# Patient Record
Sex: Female | Born: 1990 | Race: Black or African American | Hispanic: No | Marital: Single | Smoking: Light tobacco smoker
Health system: Southern US, Community
[De-identification: ages and names within clinical notes are randomized; demographics above are authoritative.]

## PROBLEM LIST (undated history)

## (undated) ENCOUNTER — Inpatient Hospital Stay: Payer: Self-pay

## (undated) DIAGNOSIS — O10919 Unspecified pre-existing hypertension complicating pregnancy, unspecified trimester: Secondary | ICD-10-CM

## (undated) DIAGNOSIS — D649 Anemia, unspecified: Secondary | ICD-10-CM

## (undated) DIAGNOSIS — F329 Major depressive disorder, single episode, unspecified: Secondary | ICD-10-CM

## (undated) DIAGNOSIS — D573 Sickle-cell trait: Secondary | ICD-10-CM

## (undated) DIAGNOSIS — F32A Depression, unspecified: Secondary | ICD-10-CM

## (undated) DIAGNOSIS — Z91199 Patient's noncompliance with other medical treatment and regimen due to unspecified reason: Secondary | ICD-10-CM

## (undated) DIAGNOSIS — D282 Benign neoplasm of uterine tubes and ligaments: Secondary | ICD-10-CM

## (undated) DIAGNOSIS — J45909 Unspecified asthma, uncomplicated: Secondary | ICD-10-CM

## (undated) DIAGNOSIS — Z9119 Patient's noncompliance with other medical treatment and regimen: Secondary | ICD-10-CM

## (undated) HISTORY — PX: TOOTH EXTRACTION: SUR596

---

## 2013-03-03 ENCOUNTER — Emergency Department: Payer: Self-pay | Admitting: Emergency Medicine

## 2013-03-03 LAB — URINALYSIS, COMPLETE
Bacteria: NONE SEEN
Bilirubin,UR: NEGATIVE
Leukocyte Esterase: NEGATIVE
Nitrite: NEGATIVE
Ph: 5 (ref 4.5–8.0)
Specific Gravity: 1.027 (ref 1.003–1.030)
Squamous Epithelial: 6

## 2013-03-03 LAB — BASIC METABOLIC PANEL
BUN: 13 mg/dL (ref 7–18)
Chloride: 106 mmol/L (ref 98–107)
Co2: 24 mmol/L (ref 21–32)
Creatinine: 0.75 mg/dL (ref 0.60–1.30)
Glucose: 76 mg/dL (ref 65–99)

## 2013-03-03 LAB — PREGNANCY, URINE: Pregnancy Test, Urine: NEGATIVE m[IU]/mL

## 2013-03-03 LAB — CBC
HCT: 37 % (ref 35.0–47.0)
HGB: 12.5 g/dL (ref 12.0–16.0)
MCH: 25.7 pg — ABNORMAL LOW (ref 26.0–34.0)
MCV: 76 fL — ABNORMAL LOW (ref 80–100)
Platelet: 282 10*3/uL (ref 150–440)
RBC: 4.86 10*6/uL (ref 3.80–5.20)

## 2013-04-26 ENCOUNTER — Emergency Department: Payer: Self-pay | Admitting: Emergency Medicine

## 2013-04-26 LAB — CBC
MCH: 26.1 pg (ref 26.0–34.0)
RBC: 4.29 10*6/uL (ref 3.80–5.20)
RDW: 16.1 % — ABNORMAL HIGH (ref 11.5–14.5)
WBC: 6.6 10*3/uL (ref 3.6–11.0)

## 2013-04-26 LAB — BASIC METABOLIC PANEL
Anion Gap: 6 — ABNORMAL LOW (ref 7–16)
Chloride: 112 mmol/L — ABNORMAL HIGH (ref 98–107)
EGFR (African American): >60
Osmolality: 283 (ref 275–301)
Potassium: 3.6 mmol/L (ref 3.5–5.1)

## 2013-04-26 LAB — URINALYSIS, COMPLETE
Ketone: NEGATIVE
Leukocyte Esterase: NEGATIVE
Nitrite: NEGATIVE
Ph: 5 (ref 4.5–8.0)
Protein: NEGATIVE
WBC UR: 1 /HPF (ref 0–5)

## 2013-04-26 LAB — HCG, QUANTITATIVE, PREGNANCY: Beta Hcg, Quant.: <1 m[IU]/mL — ABNORMAL LOW

## 2013-04-26 LAB — GC/CHLAMYDIA PROBE AMP

## 2013-04-26 LAB — WET PREP, GENITAL

## 2013-06-02 ENCOUNTER — Emergency Department: Payer: Self-pay | Admitting: Emergency Medicine

## 2013-06-02 LAB — COMPREHENSIVE METABOLIC PANEL
Albumin: 3.3 g/dL — ABNORMAL LOW (ref 3.4–5.0)
Anion Gap: 3 — ABNORMAL LOW (ref 7–16)
BUN: 11 mg/dL (ref 7–18)
Bilirubin,Total: 0.4 mg/dL (ref 0.2–1.0)
Co2: 28 mmol/L (ref 21–32)
Creatinine: 0.76 mg/dL (ref 0.60–1.30)
EGFR (African American): >60
EGFR (Non-African Amer.): >60
Glucose: 83 mg/dL (ref 65–99)
Osmolality: 272 (ref 275–301)
Potassium: 3.6 mmol/L (ref 3.5–5.1)
SGOT(AST): 13 U/L — ABNORMAL LOW (ref 15–37)
SGPT (ALT): 17 U/L (ref 12–78)
Total Protein: 7.2 g/dL (ref 6.4–8.2)

## 2013-06-02 LAB — GC/CHLAMYDIA PROBE AMP

## 2013-06-02 LAB — URINALYSIS, COMPLETE
Blood: NEGATIVE
Ketone: NEGATIVE
Leukocyte Esterase: NEGATIVE
Nitrite: NEGATIVE
Ph: 5 (ref 4.5–8.0)
Protein: NEGATIVE
Squamous Epithelial: 7

## 2013-06-02 LAB — CBC
HGB: 11.6 g/dL — ABNORMAL LOW (ref 12.0–16.0)
MCH: 25.7 pg — ABNORMAL LOW (ref 26.0–34.0)
Platelet: 302 10*3/uL (ref 150–440)
RDW: 15.8 % — ABNORMAL HIGH (ref 11.5–14.5)
WBC: 5.4 10*3/uL (ref 3.6–11.0)

## 2013-06-02 LAB — HCG, QUANTITATIVE, PREGNANCY: Beta Hcg, Quant.: <1 m[IU]/mL — ABNORMAL LOW

## 2013-06-02 LAB — WET PREP, GENITAL

## 2013-07-02 ENCOUNTER — Emergency Department: Payer: Self-pay | Admitting: Emergency Medicine

## 2013-07-02 LAB — COMPREHENSIVE METABOLIC PANEL
Alkaline Phosphatase: 73 U/L (ref 50–136)
Anion Gap: 4 — ABNORMAL LOW (ref 7–16)
Calcium, Total: 8.8 mg/dL (ref 8.5–10.1)
EGFR (Non-African Amer.): >60
Glucose: 78 mg/dL (ref 65–99)
Osmolality: 270 (ref 275–301)
Potassium: 3.3 mmol/L — ABNORMAL LOW (ref 3.5–5.1)
SGPT (ALT): 19 U/L (ref 12–78)

## 2013-07-02 LAB — CBC
HCT: 34.4 % — ABNORMAL LOW (ref 35.0–47.0)
MCH: 27.1 pg (ref 26.0–34.0)
MCV: 77 fL — ABNORMAL LOW (ref 80–100)
Platelet: 306 10*3/uL (ref 150–440)
RBC: 4.5 10*6/uL (ref 3.80–5.20)
RDW: 16.7 % — ABNORMAL HIGH (ref 11.5–14.5)

## 2013-07-02 LAB — HCG, QUANTITATIVE, PREGNANCY: Beta Hcg, Quant.: 173 m[IU]/mL — ABNORMAL HIGH

## 2013-07-02 LAB — GC/CHLAMYDIA PROBE AMP

## 2013-07-15 ENCOUNTER — Emergency Department: Payer: Self-pay | Admitting: Emergency Medicine

## 2013-07-15 LAB — CBC
HCT: 32.4 % — ABNORMAL LOW
HGB: 11.3 g/dL — ABNORMAL LOW
MCH: 26.7 pg
MCHC: 35 g/dL
MCV: 76 fL — ABNORMAL LOW
Platelet: 307 x10 3/mm 3
RBC: 4.25 X10 6/mm 3
RDW: 16.5 % — ABNORMAL HIGH
WBC: 8.4 x10 3/mm 3

## 2013-07-15 LAB — COMPREHENSIVE METABOLIC PANEL WITH GFR
Albumin: 3.3 g/dL — ABNORMAL LOW
Alkaline Phosphatase: 80 U/L
Anion Gap: 5 — ABNORMAL LOW
BUN: 11 mg/dL
Bilirubin,Total: <0.1 mg/dL — ABNORMAL LOW
Calcium, Total: 8.6 mg/dL
Chloride: 108 mmol/L — ABNORMAL HIGH
Co2: 26 mmol/L
Creatinine: 0.8 mg/dL
EGFR (African American): >60
EGFR (Non-African Amer.): >60
Glucose: 101 mg/dL — ABNORMAL HIGH
Osmolality: 277
Potassium: 3.5 mmol/L
SGOT(AST): 17 U/L
SGPT (ALT): 22 U/L
Sodium: 139 mmol/L
Total Protein: 7 g/dL

## 2013-07-15 LAB — URINALYSIS, COMPLETE
Blood: NEGATIVE
Glucose,UR: NEGATIVE mg/dL (ref 0–75)
Ketone: NEGATIVE
Ph: 6 (ref 4.5–8.0)
Specific Gravity: 1.023 (ref 1.003–1.030)
Squamous Epithelial: 7

## 2013-07-15 LAB — HCG, QUANTITATIVE, PREGNANCY: Beta Hcg, Quant.: 41 m[IU]/mL — ABNORMAL HIGH

## 2013-07-16 LAB — GC/CHLAMYDIA PROBE AMP

## 2013-07-16 LAB — WET PREP, GENITAL

## 2013-08-03 ENCOUNTER — Emergency Department: Payer: Self-pay | Admitting: Emergency Medicine

## 2013-08-03 LAB — RAPID INFLUENZA A&B ANTIGENS

## 2013-11-15 ENCOUNTER — Observation Stay: Payer: Self-pay | Admitting: Obstetrics & Gynecology

## 2013-11-15 LAB — CBC WITH DIFFERENTIAL/PLATELET
Basophil #: 0 10*3/uL (ref 0.0–0.1)
Basophil %: 0.5 %
Eosinophil #: 0.2 10*3/uL (ref 0.0–0.7)
Eosinophil %: 2.6 %
HCT: 35 % (ref 35.0–47.0)
HGB: 12.1 g/dL (ref 12.0–16.0)
LYMPHS ABS: 1.9 10*3/uL (ref 1.0–3.6)
LYMPHS PCT: 32.6 %
MCH: 26.1 pg (ref 26.0–34.0)
MCHC: 34.6 g/dL (ref 32.0–36.0)
MCV: 75 fL — ABNORMAL LOW (ref 80–100)
MONO ABS: 0.5 x10 3/mm (ref 0.2–0.9)
Monocyte %: 8.5 %
NEUTROS ABS: 3.3 10*3/uL (ref 1.4–6.5)
NEUTROS PCT: 55.8 %
PLATELETS: 289 10*3/uL (ref 150–440)
RBC: 4.65 10*6/uL (ref 3.80–5.20)
RDW: 16.2 % — AB (ref 11.5–14.5)
WBC: 6 10*3/uL (ref 3.6–11.0)

## 2013-11-15 LAB — HCG, QUANTITATIVE, PREGNANCY: Beta Hcg, Quant.: <1 m[IU]/mL — ABNORMAL LOW

## 2013-12-25 ENCOUNTER — Emergency Department: Payer: Self-pay | Admitting: Emergency Medicine

## 2013-12-25 LAB — CBC
HCT: 32.6 % — ABNORMAL LOW (ref 35.0–47.0)
HGB: 10.8 g/dL — ABNORMAL LOW (ref 12.0–16.0)
MCH: 25.4 pg — AB (ref 26.0–34.0)
MCHC: 33 g/dL (ref 32.0–36.0)
MCV: 77 fL — ABNORMAL LOW (ref 80–100)
Platelet: 279 10*3/uL (ref 150–440)
RBC: 4.24 10*6/uL (ref 3.80–5.20)
RDW: 15.9 % — AB (ref 11.5–14.5)
WBC: 7.6 10*3/uL (ref 3.6–11.0)

## 2013-12-25 LAB — HCG, QUANTITATIVE, PREGNANCY: Beta Hcg, Quant.: 189 m[IU]/mL — ABNORMAL HIGH

## 2014-10-02 IMAGING — US US PELV - US TRANSVAGINAL
1 series · 14 of 25 positions shown · non-contrast
Comparison: none

REASON FOR EXAM: left adnexal tenderness
COMMENTS:

PROCEDURE:     US  - US PELVIS EXAM W/TRANSVAGINAL  - June 02, 2013 [DATE]
RESULT:     Pelvic ultrasound dated 06/02/2013
Is correlated with prior ultrasound in 04/26/2013.

[Series 1: us pelv - us transvaginal · 0.28mm/px · 14 of 126 slices shown]
[im 1/126]
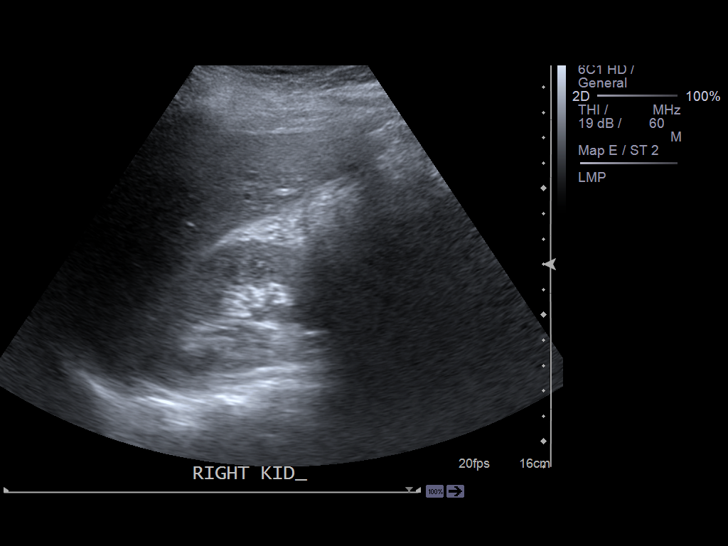
[im 11/126]
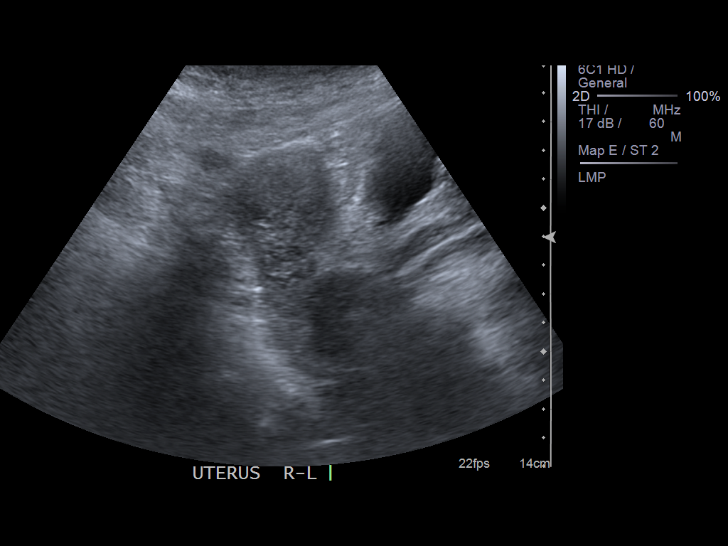
[im 21/126]
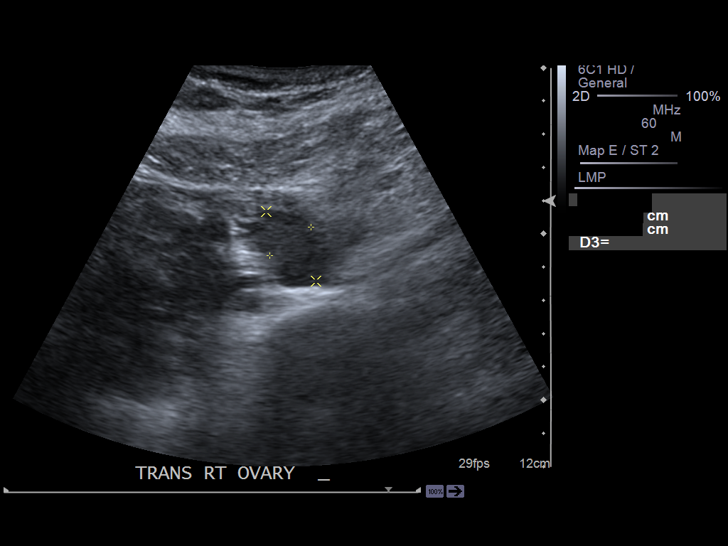
[im 32/126]
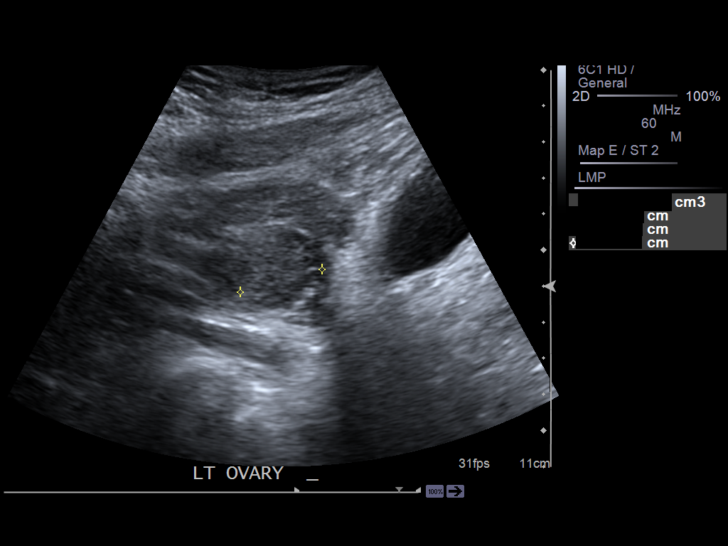
[im 42/126]
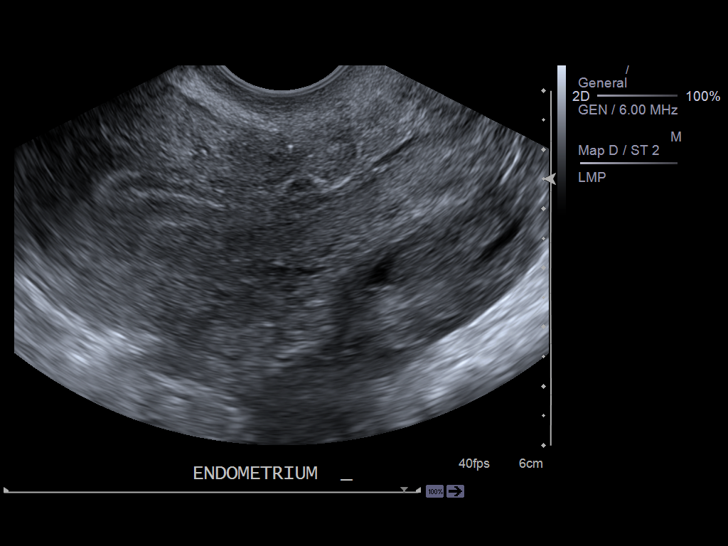
[im 47/126]
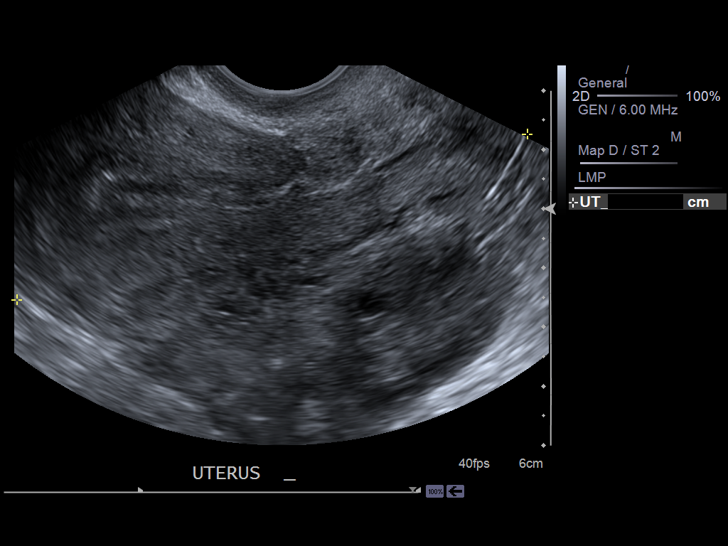
[im 58/126]
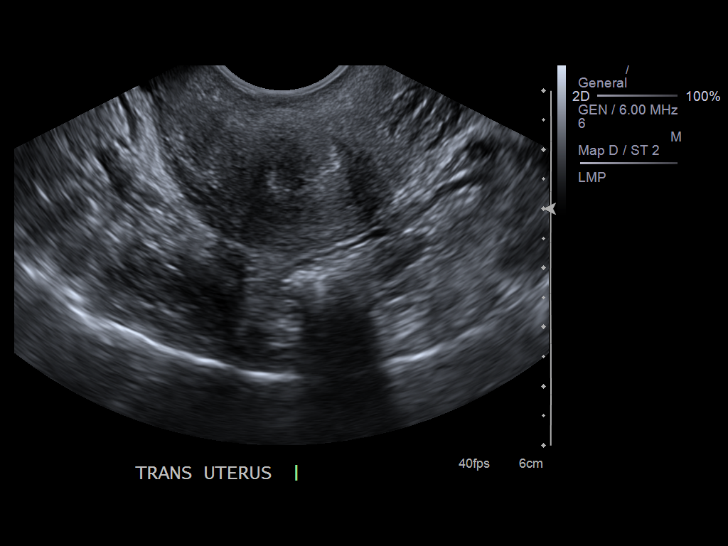
[im 68/126]
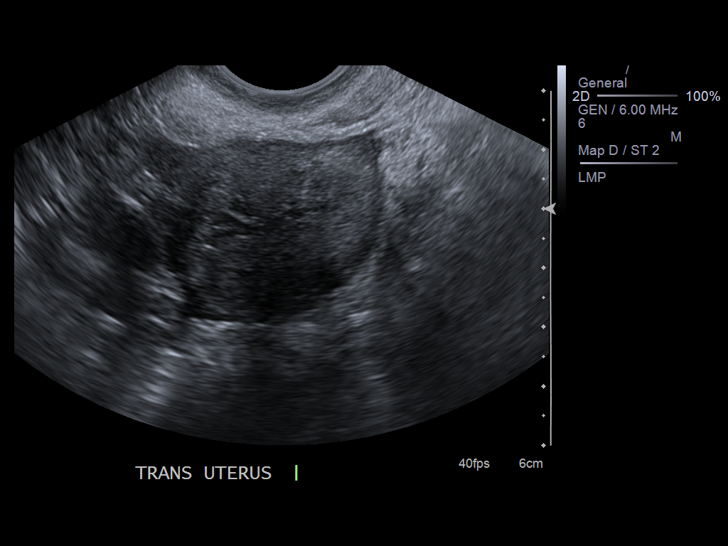
[im 79/126]
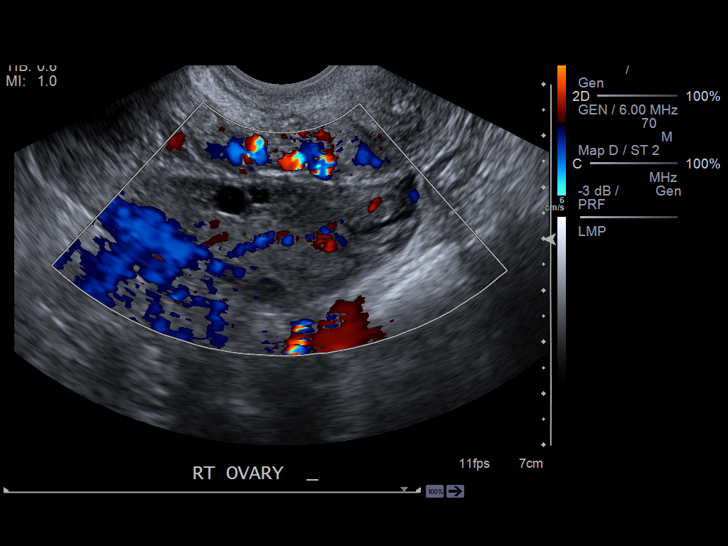
[im 84/126]
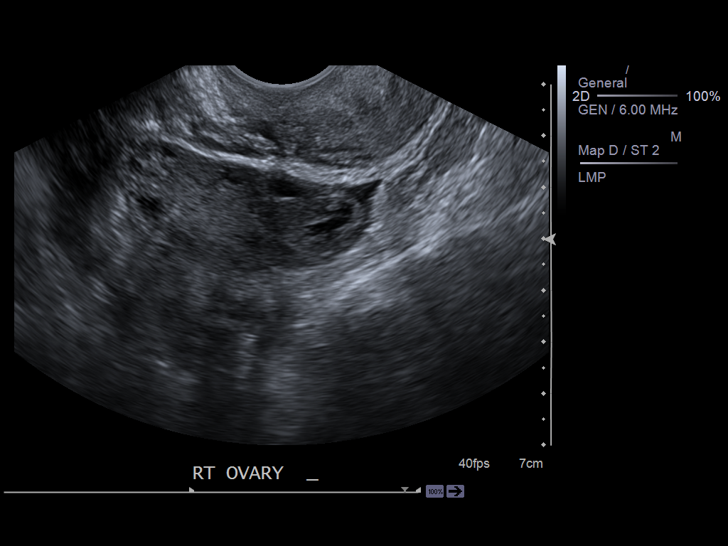
[im 94/126]
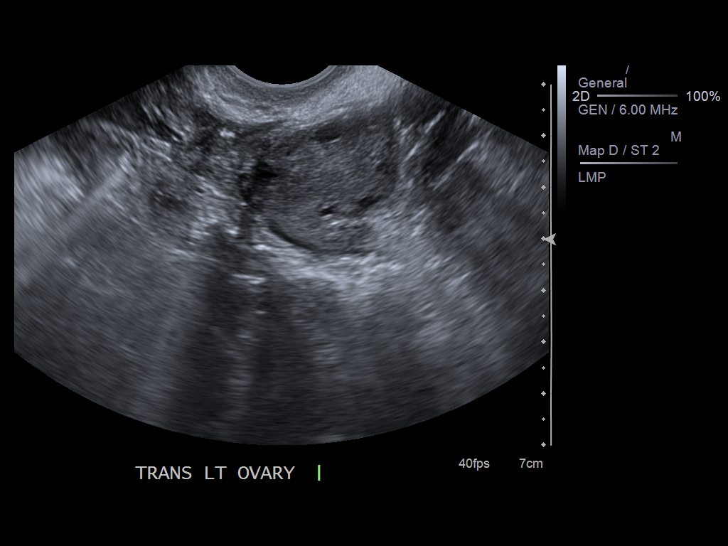
[im 105/126]
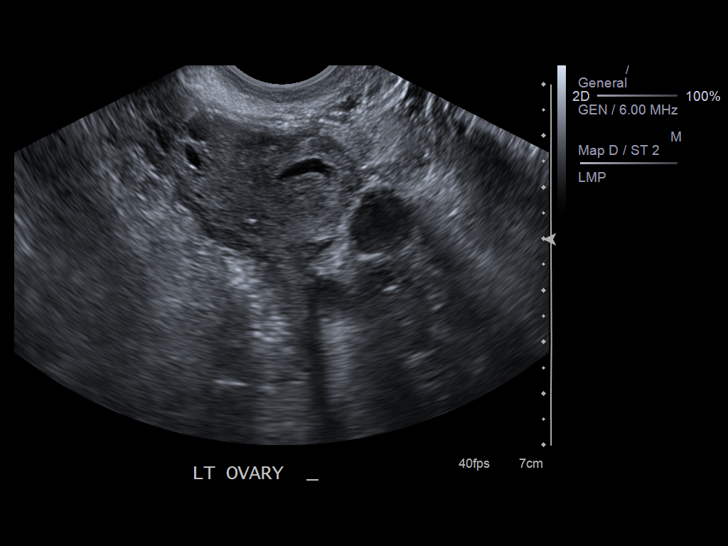
[im 115/126]
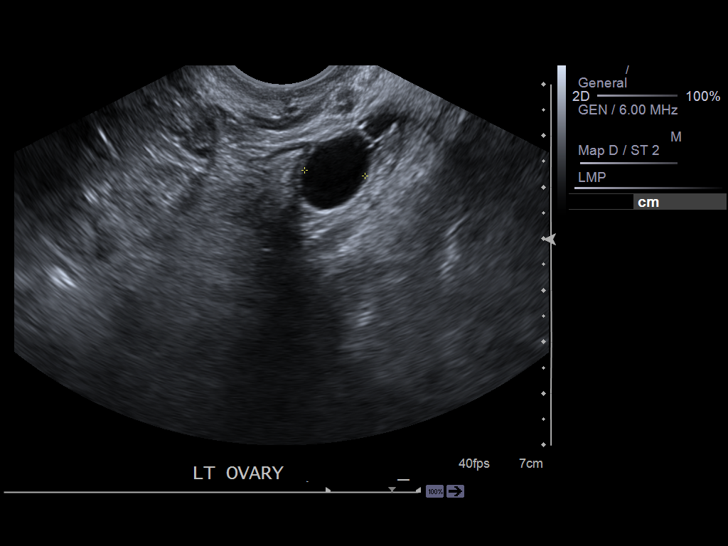
[im 126/126]
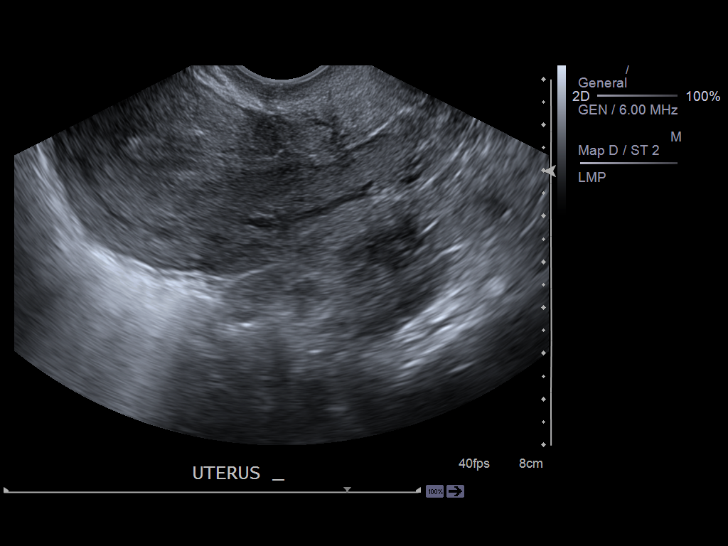

[14 of 25 positions shown; findings below may reference images not displayed]

FINDINGS: The uterus measures 9.07 x 5.04 x 4 cm. Dystrophic calcification
identified within the mid to lower uterine segment. The uterus is otherwise
homogeneous. Endometrial thickness is 2.5 mm. The right ovary measures
x 2.73 x 5.22 cm and the left 3.1 x 2.19 x 4.4 cm. Bilateral ovarian cyst
identified. Is no evidence for differences. This described cystic area to
the periphery within the left ovary is once again appreciated appears
stable. A dominant right ovarian cyst is appreciated measuring 1.5 a 1.5 x
1.67 cm. No solid masses identified.
IMPRESSION: Bilateral ovarian cysts as described above
2. Small amount of fluid within the cul-de-sac region
3. No further sonographic abnormalities.

## 2015-04-01 ENCOUNTER — Other Ambulatory Visit: Payer: Self-pay

## 2015-04-01 ENCOUNTER — Emergency Department: Payer: Medicaid Other

## 2015-04-01 ENCOUNTER — Emergency Department
Admission: EM | Admit: 2015-04-01 | Discharge: 2015-04-02 | Disposition: A | Discharge status: Home / Self Care | Payer: Medicaid Other | Attending: Emergency Medicine | Admitting: Emergency Medicine

## 2015-04-01 DIAGNOSIS — W19XXXA Unspecified fall, initial encounter: Secondary | ICD-10-CM

## 2015-04-01 DIAGNOSIS — R55 Syncope and collapse: Secondary | ICD-10-CM | POA: Insufficient documentation

## 2015-04-01 DIAGNOSIS — Z720 Tobacco use: Secondary | ICD-10-CM | POA: Diagnosis not present

## 2015-04-01 DIAGNOSIS — R519 Headache, unspecified: Secondary | ICD-10-CM

## 2015-04-01 DIAGNOSIS — M25552 Pain in left hip: Secondary | ICD-10-CM | POA: Diagnosis not present

## 2015-04-01 DIAGNOSIS — R51 Headache: Secondary | ICD-10-CM | POA: Diagnosis not present

## 2015-04-01 DIAGNOSIS — Z3202 Encounter for pregnancy test, result negative: Secondary | ICD-10-CM | POA: Insufficient documentation

## 2015-04-01 LAB — POCT PREGNANCY, URINE: Preg Test, Ur: NEGATIVE

## 2015-04-01 LAB — BASIC METABOLIC PANEL
Anion gap: 9 (ref 5–15)
BUN: 10 mg/dL (ref 6–20)
CO2: 23 mmol/L (ref 22–32)
Calcium: 9 mg/dL (ref 8.9–10.3)
Chloride: 106 mmol/L (ref 101–111)
Creatinine, Ser: 0.79 mg/dL (ref 0.44–1.00)
GFR calc Af Amer: >60 mL/min (ref >60)
GFR calc non Af Amer: >60 mL/min (ref >60)
Glucose, Bld: 101 mg/dL — ABNORMAL HIGH (ref 65–99)
Potassium: 3.5 mmol/L (ref 3.5–5.1)
Sodium: 138 mmol/L (ref 135–145)

## 2015-04-01 NOTE — ED Notes (Addendum)
To ED via [facility] c/o left hip pain and syncopal episode. Nausea and headache. left hip pain before syncope.

## 2015-04-01 NOTE — ED Notes (Signed)
Pt unable to urinate at this time, need POC pregnancy test. Pt unsure if pregnant

## 2015-04-02 ENCOUNTER — Emergency Department: Payer: Medicaid Other

## 2015-04-02 ENCOUNTER — Telehealth: Payer: Self-pay | Admitting: Emergency Medicine

## 2015-04-02 MED ORDER — KETOROLAC TROMETHAMINE 30 MG/ML IJ SOLN
30.0000 mg | Freq: Once | INTRAMUSCULAR | Status: AC
  Administered 2015-04-02: 30 mg via INTRAVENOUS

## 2015-04-02 MED ORDER — METOCLOPRAMIDE HCL 5 MG/ML IJ SOLN
INTRAMUSCULAR | Status: AC
  Filled 2015-04-02: qty 2

## 2015-04-02 MED ORDER — METOCLOPRAMIDE HCL 5 MG/ML IJ SOLN
10.0000 mg | Freq: Once | INTRAMUSCULAR | Status: AC
  Administered 2015-04-02: 10 mg via INTRAVENOUS

## 2015-04-02 MED ORDER — KETOROLAC TROMETHAMINE 30 MG/ML IJ SOLN
INTRAMUSCULAR | Status: AC
  Filled 2015-04-02: qty 1

## 2015-04-02 NOTE — Discharge Instructions (Signed)
General Headache Without Cause A general headache is pain or discomfort felt around the head or neck area. The cause may not be found.  HOME CARE   Keep all doctor visits.  Only take medicines as told by your doctor.  Lie down in a dark, quiet room when you have a headache.  Keep a journal to find out if certain things bring on headaches. For example, write down:  What you eat and drink.  How much sleep you get.  Any change to your diet or medicines.  Relax by getting a massage or doing other relaxing activities.  Put ice or heat packs on the head and neck area as told by your doctor.  Lessen stress.  Sit up straight. Do not tighten (tense) your muscles.  Quit smoking if you smoke.  Lessen how much alcohol you drink.  Lessen how much caffeine you drink, or stop drinking caffeine.  Eat and sleep on a regular schedule.  Get 7 to 9 hours of sleep, or as told by your doctor.  Keep lights dim if bright lights bother you or make your headaches worse. GET HELP RIGHT AWAY IF:   Your headache becomes really bad.  You have a fever.  You have a stiff neck.  You have trouble seeing.  Your muscles are weak, or you lose muscle control.  You lose your balance or have trouble walking.  You feel like you will pass out (faint), or you pass out.  You have really bad symptoms that are different than your first symptoms.  You have problems with the medicines given to you by your doctor.  Your medicines do not work.  Your headache feels different than the other headaches.  You feel sick to your stomach (nauseous) or throw up (vomit). MAKE SURE YOU:   Understand these instructions.  Will watch your condition.  Will get help right away if you are not doing well or get worse. Document Released: 08/02/2008 Document Revised: 01/16/2012 Document Reviewed: 10/14/2011 [facility] Patient Information 2015 [place], [place]. This information is not intended to replace advice given to  you by your health care provider. Make sure you discuss any questions you have with your health care provider.  Syncope Syncope is a medical term for fainting or passing out. This means you lose consciousness and drop to the ground. People are generally unconscious for less than 5 minutes. You may have some muscle twitches for up to 15 seconds before waking up and returning to normal. Syncope occurs more often in older adults, but it can happen to anyone. While most causes of syncope are not dangerous, syncope can be a sign of a serious medical problem. It is important to seek medical care.  CAUSES  Syncope is caused by a sudden drop in blood flow to the brain. The specific cause is often not determined. Factors that can bring on syncope include:  Taking medicines that lower blood pressure.  Sudden changes in posture, such as standing up quickly.  Taking more medicine than prescribed.  Standing in one place for too long.  Seizure disorders.  Dehydration and excessive exposure to heat.  Low blood sugar (hypoglycemia).  Straining to have a bowel movement.  Heart disease, irregular heartbeat, or other circulatory problems.  Fear, emotional distress, seeing blood, or severe pain. SYMPTOMS  Right before fainting, you may:  Feel dizzy or light-headed.  Feel nauseous.  See all white or all black in your field of vision.  Have cold, clammy skin. DIAGNOSIS  Your health care provider will ask about your symptoms, perform a physical exam, and perform an electrocardiogram (ECG) to record the electrical activity of your heart. Your health care provider may also perform other heart or blood tests to determine the cause of your syncope which may include:  Transthoracic echocardiogram (TTE). During echocardiography, sound waves are used to evaluate how blood flows through your heart.  Transesophageal echocardiogram (TEE).  Cardiac monitoring. This allows your health care provider to  monitor your heart rate and rhythm in real time.  Holter monitor. This is a portable device that records your heartbeat and can help diagnose heart arrhythmias. It allows your health care provider to track your heart activity for several days, if needed.  Stress tests by exercise or by giving medicine that makes the heart beat faster. TREATMENT  In most cases, no treatment is needed. Depending on the cause of your syncope, your health care provider may recommend changing or stopping some of your medicines. HOME CARE INSTRUCTIONS  Have someone stay with you until you feel stable.  Do not drive, use machinery, or play sports until your health care provider says it is okay.  Keep all follow-up appointments as directed by your health care provider.  Lie down right away if you start feeling like you might faint. Breathe deeply and steadily. Wait until all the symptoms have passed.  Drink enough fluids to keep your urine clear or pale yellow.  If you are taking blood pressure or heart medicine, get up slowly and take several minutes to sit and then stand. This can reduce dizziness. SEEK IMMEDIATE MEDICAL CARE IF:   You have a severe headache.  You have unusual pain in the chest, abdomen, or back.  You are bleeding from your mouth or rectum, or you have black or tarry stool.  You have an irregular or very fast heartbeat.  You have pain with breathing.  You have repeated fainting or seizure-like jerking during an episode.  You faint when sitting or lying down.  You have confusion.  You have trouble walking.  You have severe weakness.  You have vision problems. If you fainted, call your local emergency services (911 in [place] Do not drive yourself to the hospital.  MAKE SURE YOU:  Understand these instructions.  Will watch your condition.  Will get help right away if you are not doing well or get worse. Document Released: 10/24/2005 Document Revised: 10/29/2013 Document  Reviewed: 12/23/2011 [facility] Patient Information 2015 [place], [place]. This information is not intended to replace advice given to you by your health care provider. Make sure you discuss any questions you have with your health care provider.

## 2015-04-02 NOTE — ED Notes (Signed)
Patient transported to CT 

## 2015-04-02 NOTE — ED Provider Notes (Signed)
[facility] Emergency Department Provider Note  ____________________________________________  Time seen: 2:15 AM  I have reviewed the triage vital signs and the nursing notes.   HISTORY  Chief Complaint Loss of Consciousness and Hip Pain      HPI Kristin Sweeney is a 24 y.o. female resents with history of syncopal episode 1 with nausea and headache 1 day patient denies any symptoms prior to syncopal episode. Patient does admit to previous syncopal episodes in the past unsure of etiology. 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 No pertinent past medical history.  There are no active problems to display for this patient.   History reviewed. No pertinent past surgical history.  No current outpatient prescriptions on file.  Allergies Review of patient's allergies indicates no known allergies.  No family history on file.  Social History History  Substance Use Topics  . Smoking status: Current Every Day Smoker  . Smokeless tobacco: Not on file  . Alcohol Use: Yes    Review of Systems  Constitutional: Negative for fever. Eyes: Negative for visual changes. ENT: Negative for sore throat. Cardiovascular: Negative for chest pain. Respiratory: Negative for shortness of breath. Gastrointestinal: Negative for abdominal pain, vomiting and diarrhea. Genitourinary: Negative for dysuria. Musculoskeletal: Negative for back pain. Positive for left hip pain Skin: Negative for rash. Neurological: Negative for headaches, focal weakness or numbness.   10-point ROS otherwise negative.  ____________________________________________   PHYSICAL EXAM:  VITAL SIGNS: ED Triage Vitals  Enc Vitals Group     BP 04/01/15 2152 154/108 mmHg     Pulse Rate 04/01/15 2152 76     Resp 04/01/15 2152 16     Temp 04/01/15 2152 98.3 F (36.8 C)     Temp Source  04/01/15 2152 Oral     SpO2 04/01/15 2152 98 %     Weight 04/01/15 2152 230 lb (104.327 kg)     Height 04/01/15 2152 5\' 5"  (1.651 m)     Head Cir --      Peak Flow --      Pain Score 04/01/15 2153 10     Pain Loc --      Pain Edu? --      Excl. in [place]? --      Constitutional: Alert and oriented. Well appearing and in no distress. Eyes: Conjunctivae are normal. PERRL. Normal extraocular movements. ENT   Head: Normocephalic and atraumatic.   Nose: No congestion/rhinnorhea.   Mouth/Throat: Mucous membranes are moist.   Neck: No stridor. Cardiovascular: Normal rate, regular rhythm. Normal and symmetric distal pulses are present in all extremities. No murmurs, rubs, or gallops. Respiratory: Normal respiratory effort without tachypnea nor retractions. Breath sounds are clear and equal bilaterally. No wheezes/rales/rhonchi. Gastrointestinal: Soft and nontender. No distention. There is no CVA tenderness. Genitourinary: deferred Musculoskeletal: Nontender with normal range of motion in all extremities. No joint effusions.  No lower extremity tenderness nor edema. Neurologic:  Normal speech and language. No gross focal neurologic deficits are appreciated. Speech is normal.  Skin:  Skin is warm, dry and intact. No rash noted. Psychiatric: Mood and affect are normal. Speech and behavior are normal. Patient exhibits appropriate insight and judgment.  ____________________________________________    LABS (pertinent positives/negatives)  Labs Reviewed  BASIC METABOLIC PANEL - Abnormal; Notable for the following:    Glucose, Bld 101 (*)    All other components  within normal limits  POC URINE PREG, ED  POCT PREGNANCY, URINE     ____________________________________________    RADIOLOGY  CT head negative  ____________________________________________     INITIAL IMPRESSION / ASSESSMENT AND PLAN / ED COURSE  Pertinent labs & imaging results that were available during my  care of the patient were reviewed by me and considered in my medical decision making (see chart for details).  Unclear etiology for patient's syncopal episode however based on chess criteria patient will be discharged home with outpatient follow-up for syncopal episode. Regarding fallopian tube tumor that patient admonished patient states that she has a follow-up appointment with [place] [facility] to address this issue.  ____________________________________________   FINAL CLINICAL IMPRESSION(S) / ED DIAGNOSES  Final diagnoses:  Syncope, unspecified syncope type  Acute nonintractable headache, unspecified headache type      Gregor Hams, MD 04/09/15 [phone]

## 2015-04-02 NOTE — ED Notes (Signed)
Pt verbalizes understanding of discharge instructions.

## 2015-04-02 NOTE — ED Notes (Signed)
Pt back from CT

## 2015-04-02 NOTE — ED Notes (Signed)
Pt called and left message about needing referral to [facility].  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  This was diagnosed elsewhere and she was supposed tohave surgery.  She did not have the surgery last month due to being homeless.  She is in process of having her medicaid transferred as she just moved here.  i told her she need to establish with pcp and then they will refer to specialist based on what her diagnosis is.  She agrees.

## 2015-05-06 ENCOUNTER — Emergency Department: Payer: Medicaid Other

## 2015-05-06 ENCOUNTER — Encounter: Payer: Self-pay | Admitting: Emergency Medicine

## 2015-05-06 ENCOUNTER — Emergency Department
Admission: EM | Admit: 2015-05-06 | Discharge: 2015-05-06 | Disposition: A | Discharge status: Home / Self Care | Payer: Medicaid Other | Attending: Emergency Medicine | Admitting: Emergency Medicine

## 2015-05-06 DIAGNOSIS — Z3201 Encounter for pregnancy test, result positive: Secondary | ICD-10-CM | POA: Insufficient documentation

## 2015-05-06 DIAGNOSIS — F17210 Nicotine dependence, cigarettes, uncomplicated: Secondary | ICD-10-CM | POA: Diagnosis not present

## 2015-05-06 DIAGNOSIS — S99922A Unspecified injury of left foot, initial encounter: Secondary | ICD-10-CM | POA: Diagnosis present

## 2015-05-06 DIAGNOSIS — Y998 Other external cause status: Secondary | ICD-10-CM | POA: Diagnosis not present

## 2015-05-06 DIAGNOSIS — R52 Pain, unspecified: Secondary | ICD-10-CM

## 2015-05-06 DIAGNOSIS — Y9389 Activity, other specified: Secondary | ICD-10-CM | POA: Insufficient documentation

## 2015-05-06 DIAGNOSIS — X58XXXA Exposure to other specified factors, initial encounter: Secondary | ICD-10-CM | POA: Insufficient documentation

## 2015-05-06 DIAGNOSIS — Y9289 Other specified places as the place of occurrence of the external cause: Secondary | ICD-10-CM | POA: Insufficient documentation

## 2015-05-06 DIAGNOSIS — Z3490 Encounter for supervision of normal pregnancy, unspecified, unspecified trimester: Secondary | ICD-10-CM

## 2015-05-06 DIAGNOSIS — Z331 Pregnant state, incidental: Secondary | ICD-10-CM | POA: Insufficient documentation

## 2015-05-06 DIAGNOSIS — S93602A Unspecified sprain of left foot, initial encounter: Secondary | ICD-10-CM

## 2015-05-06 LAB — POCT PREGNANCY, URINE: PREG TEST UR: POSITIVE — AB

## 2015-05-06 MED ORDER — ACETAMINOPHEN 325 MG PO TABS
ORAL_TABLET | ORAL | Status: AC
  Filled 2015-05-06: qty 2

## 2015-05-06 MED ORDER — ACETAMINOPHEN 325 MG PO TABS
650.0000 mg | ORAL_TABLET | Freq: Once | ORAL | Status: AC
  Administered 2015-05-06: 650 mg via ORAL

## 2015-05-06 NOTE — ED Notes (Signed)
C/o left ankle pain, states she twisted left ankle last Friday night

## 2015-05-06 NOTE — Discharge Instructions (Signed)
Take tylenol as needed for pain

## 2015-05-06 NOTE — ED Provider Notes (Signed)
[facility] Emergency Department Provider Note  ____________________________________________  Time seen: 1204  I have reviewed the triage vital signs and the nursing notes.   HISTORY  Chief Complaint Foot Pain    HPI Kristin Sweeney is a 24 y.o. female is here today with left foot pain and a concern of pregnancy because she is having all of the symptoms but has not had time to take the test she said she has not a. She believes a little over a month no other complaints this time rates her foot pain as about a 7 out of 10 think she twisted it a couple days ago hurts when she bears weight relieved when she stays off of it rates it as a burning pain and she is here today for pregnancy test and further evaluation and treatment of her foot    No past medical history on file.  There are no active problems to display for this patient.   No past surgical history on file.  No current outpatient prescriptions on file.  Allergies Review of patient's allergies indicates no known allergies.  No family history on file.  Social History History  Substance Use Topics  . Smoking status: Current Every Day Smoker    Types: Cigarettes  . Smokeless tobacco: Not on file  . Alcohol Use: Yes    Review of Systems Constitutional: No fever/chills Eyes: No visual changes. ENT: No sore throat. Cardiovascular: Denies chest pain. Respiratory: Denies shortness of breath. Gastrointestinal: No abdominal pain.  No nausea, no vomiting.  No diarrhea.  No constipation. Genitourinary: Negative for dysuria. Musculoskeletal: Negative for back pain. Skin: Negative for rash. Neurological: Negative for headaches, focal weakness or numbness.  10-point ROS otherwise negative.  ____________________________________________   PHYSICAL EXAM:  VITAL SIGNS: ED Triage Vitals  Enc Vitals Group     BP 05/06/15 1157 136/112 mmHg     Pulse Rate 05/06/15 1157 84     Resp 05/06/15  1157 18     Temp 05/06/15 1157 99.7 F (37.6 C)     Temp Source 05/06/15 1157 Oral     SpO2 05/06/15 1157 98 %     Weight 05/06/15 1157 258 lb (117.028 kg)     Height 05/06/15 1157 5\' 6"  (1.676 m)     Head Cir --      Peak Flow --      Pain Score 05/06/15 1157 8     Pain Loc --      Pain Edu? --      Excl. in [place]? --     Constitutional: Alert and oriented. Well appearing and in no acute distress. Eyes: Conjunctivae are normal. PERRL. EOMI. Head: Atraumatic. Nose: No congestion/rhinnorhea. Mouth/Throat: Mucous membranes are moist.  Oropharynx non-erythematous. Neck: No stridor.   Cardiovascular: Normal rate, regular rhythm. Grossly normal heart sounds.  Good peripheral circulation. Respiratory: Normal respiratory effort.  No retractions. Lungs CTAB. Musculoskeletal: Pain with palpation over the lateral aspect of the metatarsals of her left foot without palpable deformity or abnormality full range of motion both active and passively more pain with flexion of the foot Neurologic:  Normal speech and language. No gross focal neurologic deficits are appreciated. Speech is normal. No gait instability. Skin:  Skin is warm, dry and intact. No rash noted. Psychiatric: Mood and affect are normal. Speech and behavior are normal.  ____________________________________________   LABS (all labs ordered are listed, but only abnormal results are displayed)  Labs Reviewed  POCT PREGNANCY, URINE -  Abnormal; Notable for the following:    Preg Test, Ur POSITIVE (*)    All other components within normal limits  POC URINE PREG, ED    RADIOLOGY  X-rays of the patient's left foot were negative I, Aubrei Bouchie,  Wilmore Holsomback, Luanna Cole, personally viewed and evaluated these images as part of my medical decision making.   ____________________________________________   PROCEDURES  Procedure(s) performed: Ace wrap and air splint were applied to the patient's left foot she is also placed on crutches  Critical  Care performed: No  ____________________________________________   INITIAL IMPRESSION / ASSESSMENT AND PLAN / ED COURSE  Pertinent labs & imaging results that were available during my care of the patient were reviewed by me and considered in my medical decision making (see chart for details).  Initial impression on this patient left foot sprain pregnancy and discussed the patient she needs to seek some prenatal care she artery has a relationship with [facility] says her she should follow-up with orthopedics given information from Dr. Roland Rack if symptoms persist return here for any acute concerns or worsening symptoms ____________________________________________   FINAL CLINICAL IMPRESSION(S) / ED DIAGNOSES  Final diagnoses:  Pain  Foot sprain, left, initial encounter  Pregnancy     Bishoy Cupp Verdene Rio, PA-C 05/06/15 1351  Nance Pear, MD 05/06/15 1353

## 2015-05-06 NOTE — ED Notes (Signed)
Pulses noted as 2+ in left foot. Patient is able to flex and extend her toes without issue. Patient ambulatory to pod d.   Patient also states "I may be pregnant"

## 2015-05-14 ENCOUNTER — Emergency Department
Admission: EM | Admit: 2015-05-14 | Discharge: 2015-05-15 | Disposition: A | Discharge status: Home / Self Care | Payer: Medicaid Other | Attending: Emergency Medicine | Admitting: Emergency Medicine

## 2015-05-14 ENCOUNTER — Encounter: Payer: Self-pay | Admitting: Emergency Medicine

## 2015-05-14 DIAGNOSIS — O26899 Other specified pregnancy related conditions, unspecified trimester: Secondary | ICD-10-CM

## 2015-05-14 DIAGNOSIS — F17210 Nicotine dependence, cigarettes, uncomplicated: Secondary | ICD-10-CM | POA: Diagnosis not present

## 2015-05-14 DIAGNOSIS — N760 Acute vaginitis: Secondary | ICD-10-CM

## 2015-05-14 DIAGNOSIS — O23591 Infection of other part of genital tract in pregnancy, first trimester: Secondary | ICD-10-CM | POA: Insufficient documentation

## 2015-05-14 DIAGNOSIS — B9689 Other specified bacterial agents as the cause of diseases classified elsewhere: Secondary | ICD-10-CM

## 2015-05-14 DIAGNOSIS — O10011 Pre-existing essential hypertension complicating pregnancy, first trimester: Secondary | ICD-10-CM | POA: Insufficient documentation

## 2015-05-14 DIAGNOSIS — Z3A01 Less than 8 weeks gestation of pregnancy: Secondary | ICD-10-CM | POA: Diagnosis not present

## 2015-05-14 DIAGNOSIS — O209 Hemorrhage in early pregnancy, unspecified: Secondary | ICD-10-CM | POA: Diagnosis present

## 2015-05-14 DIAGNOSIS — O99331 Smoking (tobacco) complicating pregnancy, first trimester: Secondary | ICD-10-CM | POA: Diagnosis not present

## 2015-05-14 DIAGNOSIS — R109 Unspecified abdominal pain: Secondary | ICD-10-CM

## 2015-05-14 HISTORY — DX: Anemia, unspecified: D64.9

## 2015-05-14 HISTORY — DX: Unspecified asthma, uncomplicated: J45.909

## 2015-05-14 LAB — ABO/RH: ABO/RH(D): B POS

## 2015-05-14 LAB — COMPREHENSIVE METABOLIC PANEL
ALBUMIN: 3.5 g/dL (ref 3.5–5.0)
ALK PHOS: 55 U/L (ref 38–126)
ALT: 19 U/L (ref 14–54)
AST: 18 U/L (ref 15–41)
Anion gap: 6 (ref 5–15)
BUN: 13 mg/dL (ref 6–20)
CO2: 24 mmol/L (ref 22–32)
CREATININE: 0.74 mg/dL (ref 0.44–1.00)
Calcium: 8.6 mg/dL — ABNORMAL LOW (ref 8.9–10.3)
Chloride: 110 mmol/L (ref 101–111)
GFR calc Af Amer: >60 mL/min (ref >60)
GLUCOSE: 106 mg/dL — AB (ref 65–99)
Potassium: 3.6 mmol/L (ref 3.5–5.1)
Sodium: 140 mmol/L (ref 135–145)
Total Bilirubin: 0.3 mg/dL (ref 0.3–1.2)
Total Protein: 7 g/dL (ref 6.5–8.1)

## 2015-05-14 LAB — CBC WITH DIFFERENTIAL/PLATELET
BASOS ABS: 0.1 10*3/uL (ref 0–0.1)
Basophils Relative: 1 %
EOS PCT: 2 %
Eosinophils Absolute: 0.2 10*3/uL (ref 0–0.7)
HCT: 30.4 % — ABNORMAL LOW (ref 35.0–47.0)
Hemoglobin: 10.3 g/dL — ABNORMAL LOW (ref 12.0–16.0)
LYMPHS ABS: 2.9 10*3/uL (ref 1.0–3.6)
LYMPHS PCT: 33 %
MCH: 25.7 pg — AB (ref 26.0–34.0)
MCHC: 33.8 g/dL (ref 32.0–36.0)
MCV: 75.9 fL — AB (ref 80.0–100.0)
Monocytes Absolute: 0.7 10*3/uL (ref 0.2–0.9)
Monocytes Relative: 8 %
NEUTROS ABS: 4.9 10*3/uL (ref 1.4–6.5)
NEUTROS PCT: 56 %
PLATELETS: 277 10*3/uL (ref 150–440)
RBC: 4 MIL/uL (ref 3.80–5.20)
RDW: 16.3 % — ABNORMAL HIGH (ref 11.5–14.5)
WBC: 8.8 10*3/uL (ref 3.6–11.0)

## 2015-05-14 LAB — TROPONIN I: Troponin I: <0.03 ng/mL (ref <0.031)

## 2015-05-14 LAB — OB RESULTS CONSOLE ABO/RH: RH TYPE: POSITIVE

## 2015-05-14 NOTE — ED Provider Notes (Signed)
[facility] Emergency Department Provider Note  ____________________________________________  Time seen: Approximately 11:10 PM  I have reviewed the triage vital signs and the nursing notes.   HISTORY  Chief Complaint Abdominal Pain and Vaginal Bleeding    HPI Kristin Sweeney is a 24 y.o. female who is a G6 P2 with a previous ectopic pregnancy who presents with left lower quadrant pain and vaginal bleeding for the past day. She says that she has had a constant dull pain with 2-3 second bursts of sharp pain to the left lower quadrant. She also reports more frequent urination. She says she has had spotting as well as a flow of blood several hours ago without any tissue passed. She says that now she feels that it has returned to a mild spotting. No dysuria. Says that she does have a left-sided tubal mass which was originally thought to be a ovarian cyst but now thought to be a mass that was supposed to be removed surgically. However, she has moved from [place] to [place] and now back again and says that her care has been discontinuous. Is not taking prenatal vitamins at this time. Is trying to get set up with prenatal care but says cannot go to [place] side because of her insurance status. Is trying to get set up with the health department. Not requesting any pain medication at this time. Says does not want any medication. Only mild pain now. Has had nausea and vomiting about this pregnancy but not complaining of any issues at this time.Does have a history of trichomonas in the past.   Past Medical History  Diagnosis Date  . Hypertension   . Asthma   . Anemia     There are no active problems to display for this patient.   History reviewed. No pertinent past surgical history.  No current outpatient prescriptions on file.  Allergies Review of patient's allergies indicates no known allergies.  No family history on file.  Social History History   Substance Use Topics  . Smoking status: Current Every Day Smoker    Types: Cigarettes  . Smokeless tobacco: Not on file  . Alcohol Use: Yes    Review of Systems Constitutional: No fever/chills Eyes: No visual changes. ENT: No sore throat. Cardiovascular: Denies chest pain. Respiratory: Denies shortness of breath. Gastrointestinal:  No diarrhea.  No constipation. Genitourinary: Negative for dysuria. Musculoskeletal: Negative for back pain. Skin: Negative for rash. Neurological: Negative for headaches, focal weakness or numbness.  10-point ROS otherwise negative.  ____________________________________________   PHYSICAL EXAM:  VITAL SIGNS: ED Triage Vitals  Enc Vitals Group     BP 05/14/15 2220 134/76 mmHg     Pulse Rate 05/14/15 2220 86     Resp 05/14/15 2220 20     Temp 05/14/15 2220 98.1 F (36.7 C)     Temp Source 05/14/15 2220 Oral     SpO2 05/14/15 2220 98 %     Weight 05/14/15 2220 234 lb (106.142 kg)     Height 05/14/15 2220 5\' 6"  (1.676 m)     Head Cir --      Peak Flow --      Pain Score 05/14/15 2221 10     Pain Loc --      Pain Edu? --      Excl. in [place]? --     Constitutional: Alert and oriented. Well appearing and in no acute distress. Eyes: Conjunctivae are normal. PERRL. EOMI. Head: Atraumatic. Nose: No congestion/rhinnorhea.  Mouth/Throat: Mucous membranes are moist.  Oropharynx non-erythematous. Neck: No stridor.   Cardiovascular: Normal rate, regular rhythm. Grossly normal heart sounds.  Good peripheral circulation. Respiratory: Normal respiratory effort.  No retractions. Lungs CTAB. Gastrointestinal: Soft with mild tenderness palpation to the lower abdomen. There is no rebound or guarding. No distention. No abdominal bruits. No CVA tenderness. Genitourinary: Musculoskeletal: No lower extremity tenderness nor edema.  No joint effusions. Neurologic:  Normal speech and language. No gross focal neurologic deficits are appreciated. Speech is normal.  No gait instability. Skin:  Skin is warm, dry and intact. No rash noted. Psychiatric: Mood and affect are normal. Speech and behavior are normal.  ____________________________________________   LABS (all labs ordered are listed, but only abnormal results are displayed)  Labs Reviewed  WET PREP, GENITAL - Abnormal; Notable for the following:    Clue Cells Wet Prep HPF POC RARE (*)    WBC, Wet Prep HPF POC MODERATE (*)    All other components within normal limits  CBC WITH DIFFERENTIAL/PLATELET - Abnormal; Notable for the following:    Hemoglobin 10.3 (*)    HCT 30.4 (*)    MCV 75.9 (*)    MCH 25.7 (*)    RDW 16.3 (*)    All other components within normal limits  COMPREHENSIVE METABOLIC PANEL - Abnormal; Notable for the following:    Glucose, Bld 106 (*)    Calcium 8.6 (*)    All other components within normal limits  URINALYSIS COMPLETEWITH MICROSCOPIC ([facility] ONLY) - Abnormal; Notable for the following:    Color, Urine YELLOW (*)    APPearance CLEAR (*)    Squamous Epithelial / LPF 0-5 (*)    All other components within normal limits  HCG, QUANTITATIVE, PREGNANCY - Abnormal; Notable for the following:    hCG, Beta Chain, Quant, S 3200 (*)    All other components within normal limits  POCT PREGNANCY, URINE - Abnormal; Notable for the following:    Preg Test, Ur POSITIVE (*)    All other components within normal limits  CHLAMYDIA/NGC RT PCR ([facility] ONLY)  TROPONIN I  POC URINE PREG, ED  TYPE AND SCREEN  ABO/RH   ____________________________________________  EKG  ED ECG REPORT I, Doran Stabler, the attending physician, personally viewed and interpreted this ECG.   Date: 05/14/2015  EKG Time: 2243  Rate: 70  Rhythm: normal sinus rhythm  Axis: Normal axis  Intervals:none  ST&T Change: No ST elevations or depressions. No abnormal T-wave inversions.  Voltage criteria for LVH.  ____________________________________________  RADIOLOGY Single  intrauterine gestational sac noted at 5 weeks and 2 days.  ____________________________________________   PROCEDURES    ____________________________________________   INITIAL IMPRESSION / ASSESSMENT AND PLAN / ED COURSE  Pertinent labs & imaging results that were available during my care of the patient were reviewed by me and considered in my medical decision making (see chart for details).  ----------------------------------------- 2:03 AM on 05/15/2015 -----------------------------------------  Patient resting comfortably at this time. No vomiting in the emergency department. We'll treat for bacterial vaginosis. We'll discharge with prenatal vitamins as well as vitamin B6. Discussed case with Dr. Kenton Kingfisher who said that the patient could be seen Monday as a follow-up in the clinic at [facility] side where the patient has been seen before.  Says that the patient should have a repeat hCG in 2 days that I can write him a prescription to follow-up with [facility] side OB/GYN with the results. ____________________________________________   FINAL CLINICAL IMPRESSION(S) /  ED DIAGNOSES  Acute lower abdominal pain and pregnancy. Acute bacterial vaginosis. Initial visit.      Orbie Pyo, MD 05/15/15 [phone]

## 2015-05-14 NOTE — ED Notes (Signed)
Pt presents to ED via EMS with c/o of vaginal bleeding and left side abdominal pain. EMS states pt is unaware of exact date of conception/pregnancy week number. 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 EMS states pt reports vaginal spotting starting since yesterday, minimal amount gradually increasing to steady amount today. EMS states pt denies clots at this time. Pt arrived to ED alert and oriented x4. Pt states pain is located to left side abdomen radiating to left back area. Pt states blood color is light red, pink. Pt states she has had 3 live births and 3 miscarriages. VS per EMS listed below:   154/90 BP 82 HR 98% O2 RA NSR

## 2015-05-15 ENCOUNTER — Emergency Department: Payer: Medicaid Other

## 2015-05-15 LAB — URINALYSIS COMPLETE WITH MICROSCOPIC (ARMC ONLY)
BACTERIA UA: NONE SEEN
Bilirubin Urine: NEGATIVE
GLUCOSE, UA: NEGATIVE mg/dL
Hgb urine dipstick: NEGATIVE
Ketones, ur: NEGATIVE mg/dL
Leukocytes, UA: NEGATIVE
Nitrite: NEGATIVE
Protein, ur: NEGATIVE mg/dL
Specific Gravity, Urine: 1.028 (ref 1.005–1.030)
pH: 6 (ref 5.0–8.0)

## 2015-05-15 LAB — CHLAMYDIA/NGC RT PCR (ARMC ONLY)
CHLAMYDIA TR: NOT DETECTED
N gonorrhoeae: NOT DETECTED

## 2015-05-15 LAB — TYPE AND SCREEN
ABO/RH(D): B POS
ANTIBODY SCREEN: NEGATIVE

## 2015-05-15 LAB — WET PREP, GENITAL
Trich, Wet Prep: NONE SEEN
Yeast Wet Prep HPF POC: NONE SEEN

## 2015-05-15 LAB — HCG, QUANTITATIVE, PREGNANCY: hCG, Beta Chain, Quant, S: 3200 m[IU]/mL — ABNORMAL HIGH (ref <5)

## 2015-05-15 LAB — POCT PREGNANCY, URINE: Preg Test, Ur: POSITIVE — AB

## 2015-05-15 MED ORDER — PRENATAL VITAMINS 0.8 MG PO TABS: 1.0000 | ORAL_TABLET | Freq: Every day | ORAL | Status: DC

## 2015-05-15 MED ORDER — METRONIDAZOLE 500 MG PO TABS: 500.0000 mg | ORAL_TABLET | Freq: Two times a day (BID) | ORAL | Status: AC

## 2015-05-15 MED ORDER — ACETAMINOPHEN 325 MG PO TABS
650.0000 mg | ORAL_TABLET | Freq: Once | ORAL | Status: AC
Start: 2015-05-15 — End: 2015-05-15
  Administered 2015-05-15: 650 mg via ORAL
  Filled 2015-05-15: qty 2

## 2015-05-15 MED ORDER — METRONIDAZOLE 500 MG PO TABS
500.0000 mg | ORAL_TABLET | Freq: Once | ORAL | Status: AC
  Administered 2015-05-15: 500 mg via ORAL
  Filled 2015-05-15: qty 1

## 2015-05-15 MED ORDER — METOCLOPRAMIDE HCL 10 MG PO TABS
10.0000 mg | ORAL_TABLET | Freq: Once | ORAL | Status: AC
  Administered 2015-05-15: 10 mg via ORAL
  Filled 2015-05-15: qty 1

## 2015-05-15 MED ORDER — VITAMIN B-6 50 MG PO TABS: 75.0000 mg | ORAL_TABLET | Freq: Every day | ORAL | Status: DC

## 2015-05-15 NOTE — ED Notes (Signed)
Patient with no complaints at this time. Respirations even and unlabored. Skin warm/dry. Discharge instructions reviewed with patient at this time. Patient given opportunity to voice concerns/ask questions. IV removed per policy and band-aid applied to site. Patient discharged at this time and left Emergency Department with steady gait.  

## 2015-05-15 NOTE — Discharge Instructions (Signed)

## 2015-05-21 ENCOUNTER — Emergency Department
Admission: EM | Admit: 2015-05-21 | Discharge: 2015-05-21 | Disposition: A | Discharge status: Home / Self Care | Payer: Medicaid Other | Attending: Emergency Medicine | Admitting: Emergency Medicine

## 2015-05-21 ENCOUNTER — Encounter: Payer: Self-pay | Admitting: Urgent Care

## 2015-05-21 ENCOUNTER — Emergency Department: Payer: Medicaid Other

## 2015-05-21 DIAGNOSIS — O200 Threatened abortion: Secondary | ICD-10-CM

## 2015-05-21 DIAGNOSIS — Y9389 Activity, other specified: Secondary | ICD-10-CM | POA: Diagnosis not present

## 2015-05-21 DIAGNOSIS — Y998 Other external cause status: Secondary | ICD-10-CM | POA: Insufficient documentation

## 2015-05-21 DIAGNOSIS — S3991XA Unspecified injury of abdomen, initial encounter: Secondary | ICD-10-CM | POA: Insufficient documentation

## 2015-05-21 DIAGNOSIS — O9A211 Injury, poisoning and certain other consequences of external causes complicating pregnancy, first trimester: Secondary | ICD-10-CM | POA: Diagnosis present

## 2015-05-21 DIAGNOSIS — Y9289 Other specified places as the place of occurrence of the external cause: Secondary | ICD-10-CM | POA: Insufficient documentation

## 2015-05-21 DIAGNOSIS — Z79899 Other long term (current) drug therapy: Secondary | ICD-10-CM | POA: Insufficient documentation

## 2015-05-21 DIAGNOSIS — F17210 Nicotine dependence, cigarettes, uncomplicated: Secondary | ICD-10-CM | POA: Diagnosis not present

## 2015-05-21 DIAGNOSIS — O10011 Pre-existing essential hypertension complicating pregnancy, first trimester: Secondary | ICD-10-CM | POA: Diagnosis not present

## 2015-05-21 DIAGNOSIS — Z3A01 Less than 8 weeks gestation of pregnancy: Secondary | ICD-10-CM | POA: Diagnosis not present

## 2015-05-21 DIAGNOSIS — R1084 Generalized abdominal pain: Secondary | ICD-10-CM

## 2015-05-21 DIAGNOSIS — O99331 Smoking (tobacco) complicating pregnancy, first trimester: Secondary | ICD-10-CM | POA: Insufficient documentation

## 2015-05-21 DIAGNOSIS — Z3491 Encounter for supervision of normal pregnancy, unspecified, first trimester: Secondary | ICD-10-CM

## 2015-05-21 HISTORY — DX: Benign neoplasm of uterine tubes and ligaments: D28.2

## 2015-05-21 LAB — URINALYSIS COMPLETE WITH MICROSCOPIC (ARMC ONLY)
BILIRUBIN URINE: NEGATIVE
Bacteria, UA: NONE SEEN
GLUCOSE, UA: NEGATIVE mg/dL
Hgb urine dipstick: NEGATIVE
NITRITE: NEGATIVE
PROTEIN: NEGATIVE mg/dL
SPECIFIC GRAVITY, URINE: 1.03 (ref 1.005–1.030)
pH: 5 (ref 5.0–8.0)
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
## 2015-05-21 NOTE — Discharge Instructions (Signed)
First Trimester of Pregnancy The first trimester of pregnancy is from week 1 until the end of week 12 (months 1 through 3). A week after a sperm fertilizes an egg, the egg will implant on the wall of the uterus. This embryo will begin to develop into a baby. Genes from you and your partner are forming the baby. The female genes determine whether the baby is a boy or a girl. At 6-8 weeks, the eyes and face are formed, and the heartbeat can be seen on ultrasound. At the end of 12 weeks, all the baby's organs are formed.  Now that you are pregnant, you will want to do everything you can to have a healthy baby. Two of the most important things are to get good prenatal care and to follow your health care provider's instructions. Prenatal care is all the medical care you receive before the baby's birth. This care will help prevent, find, and treat any problems during the pregnancy and childbirth. BODY CHANGES Your body goes through many changes during pregnancy. The changes vary from woman to woman.   You may gain or lose a couple of pounds at first.  You may feel sick to your stomach (nauseous) and throw up (vomit). If the vomiting is uncontrollable, call your health care provider.  You may tire easily.  You may develop headaches that can be relieved by medicines approved by your health care provider.  You may urinate more often. Painful urination may mean you have a bladder infection.  You may develop heartburn as a result of your pregnancy.  You may develop constipation because certain hormones are causing the muscles that push waste through your intestines to slow down.  You may develop hemorrhoids or swollen, bulging veins (varicose veins).  Your breasts may begin to grow larger and become tender. Your nipples may stick out more, and the tissue that surrounds them (areola) may become darker.  Your gums may bleed and may be sensitive to brushing and flossing.  Dark spots or blotches (chloasma,  mask of pregnancy) may develop on your face. This will likely fade after the baby is born.  Your menstrual periods will stop.  You may have a loss of appetite.  You may develop cravings for certain kinds of food.  You may have changes in your emotions from day to day, such as being excited to be pregnant or being concerned that something may go wrong with the pregnancy and baby.  You may have more vivid and strange dreams.  You may have changes in your hair. These can include thickening of your hair, rapid growth, and changes in texture. Some women also have hair loss during or after pregnancy, or hair that feels dry or thin. Your hair will most likely return to normal after your baby is born. WHAT TO EXPECT AT YOUR PRENATAL VISITS During a routine prenatal visit:  You will be weighed to make sure you and the baby are growing normally.  Your blood pressure will be taken.  Your abdomen will be measured to track your baby's growth.  The fetal heartbeat will be listened to starting around week 10 or 12 of your pregnancy.  Test results from any previous visits will be discussed. Your health care provider may ask you:  How you are feeling.  If you are feeling the baby move.  If you have had any abnormal symptoms, such as leaking fluid, bleeding, severe headaches, or abdominal cramping.  If you have any questions. Other tests  that may be performed during your first trimester include:  Blood tests to find your blood type and to check for the presence of any previous infections. They will also be used to check for low iron levels (anemia) and Rh antibodies. Later in the pregnancy, blood tests for diabetes will be done along with other tests if problems develop.  Urine tests to check for infections, diabetes, or protein in the urine.  An ultrasound to confirm the proper growth and development of the baby.  An amniocentesis to check for possible genetic problems.  Fetal screens for  spina bifida and Down syndrome.  You may need other tests to make sure you and the baby are doing well. HOME CARE INSTRUCTIONS  Medicines  Follow your health care provider's instructions regarding medicine use. Specific medicines may be either safe or unsafe to take during pregnancy.  Take your prenatal vitamins as directed.  If you develop constipation, try taking a stool softener if your health care provider approves. Diet  Eat regular, well-balanced meals. Choose a variety of foods, such as meat or vegetable-based protein, fish, milk and low-fat dairy products, vegetables, fruits, and whole grain breads and cereals. Your health care provider will help you determine the amount of weight gain that is right for you.  Avoid raw meat and uncooked cheese. These carry germs that can cause birth defects in the baby.  Eating four or five small meals rather than three large meals a day may help relieve nausea and vomiting. If you start to feel nauseous, eating a few soda crackers can be helpful. Drinking liquids between meals instead of during meals also seems to help nausea and vomiting.  If you develop constipation, eat more high-fiber foods, such as fresh vegetables or fruit and whole grains. Drink enough fluids to keep your urine clear or pale yellow. Activity and Exercise  Exercise only as directed by your health care provider. Exercising will help you:  Control your weight.  Stay in shape.  Be prepared for labor and delivery.  Experiencing pain or cramping in the lower abdomen or low back is a good sign that you should stop exercising. Check with your health care provider before continuing normal exercises.  Try to avoid standing for long periods of time. Move your legs often if you must stand in one place for a long time.  Avoid heavy lifting.  Wear low-heeled shoes, and practice good posture.  You may continue to have sex unless your health care provider directs you  otherwise. Relief of Pain or Discomfort  Wear a good support bra for breast tenderness.   Take warm sitz baths to soothe any pain or discomfort caused by hemorrhoids. Use hemorrhoid cream if your health care provider approves.   Rest with your legs elevated if you have leg cramps or low back pain.  If you develop varicose veins in your legs, wear support hose. Elevate your feet for 15 minutes, 3-4 times a day. Limit salt in your diet. Prenatal Care  Schedule your prenatal visits by the twelfth week of pregnancy. They are usually scheduled monthly at first, then more often in the last 2 months before delivery.  Write down your questions. Take them to your prenatal visits.  Keep all your prenatal visits as directed by your health care provider. Safety  Wear your seat belt at all times when driving.  Make a list of emergency phone numbers, including numbers for family, friends, the hospital, and police and fire departments. General Tips  Ask your health care provider for a referral to a local prenatal education class. Begin classes no later than at the beginning of month 6 of your pregnancy.  Ask for help if you have counseling or nutritional needs during pregnancy. Your health care provider can offer advice or refer you to specialists for help with various needs.  Do not use hot tubs, steam rooms, or saunas.  Do not douche or use tampons or scented sanitary pads.  Do not cross your legs for long periods of time.  Avoid cat litter boxes and soil used by cats. These carry germs that can cause birth defects in the baby and possibly loss of the fetus by miscarriage or stillbirth.  Avoid all smoking, herbs, alcohol, and medicines not prescribed by your health care provider. Chemicals in these affect the formation and growth of the baby.  Schedule a dentist appointment. At home, brush your teeth with a soft toothbrush and be gentle when you floss. SEEK MEDICAL CARE IF:   You have  dizziness.  You have mild pelvic cramps, pelvic pressure, or nagging pain in the abdominal area.  You have persistent nausea, vomiting, or diarrhea.  You have a bad smelling vaginal discharge.  You have pain with urination.  You notice increased swelling in your face, hands, legs, or ankles. SEEK IMMEDIATE MEDICAL CARE IF:   You have a fever.  You are leaking fluid from your vagina.  You have spotting or bleeding from your vagina.  You have severe abdominal cramping or pain.  You have rapid weight gain or loss.  You vomit blood or material that looks like coffee grounds.  You are exposed to [place] measles and have never had them.  You are exposed to fifth disease or chickenpox.  You develop a severe headache.  You have shortness of breath.  You have any kind of trauma, such as from a fall or a car accident. Document Released: 10/18/2001 Document Revised: 03/10/2014 Document Reviewed: 09/03/2013 [facility] Patient Information 2015 [place], [place]. This information is not intended to replace advice given to you by your health care provider. Make sure you discuss any questions you have with your health care provider.  Threatened Miscarriage A threatened miscarriage occurs when you have vaginal bleeding during your first 20 weeks of pregnancy but the pregnancy has not ended. If you have vaginal bleeding during this time, your health care provider will do tests to make sure you are still pregnant. If the tests show you are still pregnant and the developing baby (fetus) inside your womb (uterus) is still growing, your condition is considered a threatened miscarriage. A threatened miscarriage does not mean your pregnancy will end, but it does increase the risk of losing your pregnancy (complete miscarriage). CAUSES  The cause of a threatened miscarriage is usually not known. If you go on to have a complete miscarriage, the most common cause is an abnormal number of chromosomes in the  developing baby. Chromosomes are the structures inside cells that hold all your genetic material. Some causes of vaginal bleeding that do not result in miscarriage include:  Having sex.  Having an infection.  Normal hormone changes of pregnancy.  Bleeding that occurs when an egg implants in your uterus. RISK FACTORS Risk factors for bleeding in early pregnancy include:  Obesity.  Smoking.  Drinking excessive amounts of alcohol or caffeine.  Recreational drug use. SIGNS AND SYMPTOMS  Light vaginal bleeding.  Mild abdominal pain or cramps. DIAGNOSIS  If you have bleeding with  or without abdominal pain before 20 weeks of pregnancy, your health care provider will do tests to check whether you are still pregnant. One important test involves using sound waves and a computer (ultrasound) to create images of the inside of your uterus. Other tests include an internal exam of your vagina and uterus (pelvic exam) and measurement of your baby's heart rate.  You may be diagnosed with a threatened miscarriage if:  Ultrasound testing shows you are still pregnant.  Your baby's heart rate is strong.  A pelvic exam shows that the opening between your uterus and your vagina (cervix) is closed.  Your heart rate and blood pressure are stable.  Blood tests confirm you are still pregnant. TREATMENT  No treatments have been shown to prevent a threatened miscarriage from going on to a complete miscarriage. However, the right home care is important.  HOME CARE INSTRUCTIONS   Make sure you keep all your appointments for prenatal care. This is very important.  Get plenty of rest.  Do not have sex or use tampons if you have vaginal bleeding.  Do not douche.  Do not smoke or use recreational drugs.  Do not drink alcohol.  Avoid caffeine. SEEK MEDICAL CARE IF:  You have light vaginal bleeding or spotting while pregnant.  You have abdominal pain or cramping.  You have a fever. SEEK  IMMEDIATE MEDICAL CARE IF:  You have heavy vaginal bleeding.  You have blood clots coming from your vagina.  You have severe low back pain or abdominal cramps.  You have fever, chills, and severe abdominal pain. MAKE SURE YOU:  Understand these instructions.  Will watch your condition.  Will get help right away if you are not doing well or get worse. Document Released: 10/24/2005 Document Revised: 10/29/2013 Document Reviewed: 08/20/2013 [facility] Patient Information 2015 [place], [place]. This information is not intended to replace advice given to you by your health care provider. Make sure you discuss any questions you have with your health care provider.

## 2015-05-21 NOTE — ED Notes (Signed)
Spoke with Dr. Owens Shark regarding labs previously ordered by Reita Cliche, MD. Of note, [facility] was not aware of the fact that patient was here on 7/8 for the same. Owens Shark, MD with order to cancel labs and just do U/S and UA. Lab made aware.

## 2015-06-17 LAB — OB RESULTS CONSOLE GC/CHLAMYDIA
Chlamydia: NEGATIVE
Gonorrhea: NEGATIVE

## 2015-08-04 ENCOUNTER — Encounter: Payer: Self-pay | Admitting: *Deleted

## 2015-08-04 ENCOUNTER — Emergency Department: Payer: Medicaid Other

## 2015-08-04 ENCOUNTER — Emergency Department
Admission: EM | Admit: 2015-08-04 | Discharge: 2015-08-04 | Disposition: A | Discharge status: Home / Self Care | Payer: Medicaid Other | Attending: Emergency Medicine | Admitting: Emergency Medicine

## 2015-08-04 DIAGNOSIS — O200 Threatened abortion: Secondary | ICD-10-CM | POA: Diagnosis not present

## 2015-08-04 DIAGNOSIS — Z3A17 17 weeks gestation of pregnancy: Secondary | ICD-10-CM | POA: Insufficient documentation

## 2015-08-04 DIAGNOSIS — O209 Hemorrhage in early pregnancy, unspecified: Secondary | ICD-10-CM | POA: Diagnosis present

## 2015-08-04 DIAGNOSIS — O23512 Infections of cervix in pregnancy, second trimester: Secondary | ICD-10-CM | POA: Diagnosis not present

## 2015-08-04 DIAGNOSIS — Z87891 Personal history of nicotine dependence: Secondary | ICD-10-CM | POA: Insufficient documentation

## 2015-08-04 DIAGNOSIS — O10012 Pre-existing essential hypertension complicating pregnancy, second trimester: Secondary | ICD-10-CM | POA: Diagnosis not present

## 2015-08-04 DIAGNOSIS — B9689 Other specified bacterial agents as the cause of diseases classified elsewhere: Secondary | ICD-10-CM

## 2015-08-04 DIAGNOSIS — Z79899 Other long term (current) drug therapy: Secondary | ICD-10-CM | POA: Insufficient documentation

## 2015-08-04 DIAGNOSIS — N939 Abnormal uterine and vaginal bleeding, unspecified: Secondary | ICD-10-CM

## 2015-08-04 DIAGNOSIS — N760 Acute vaginitis: Secondary | ICD-10-CM

## 2015-08-04 LAB — COMPREHENSIVE METABOLIC PANEL
ALBUMIN: 3.3 g/dL — AB (ref 3.5–5.0)
ALT: 15 U/L (ref 14–54)
AST: 19 U/L (ref 15–41)
Alkaline Phosphatase: 49 U/L (ref 38–126)
Anion gap: 9 (ref 5–15)
BUN: 10 mg/dL (ref 6–20)
CHLORIDE: 102 mmol/L (ref 101–111)
CO2: 21 mmol/L — ABNORMAL LOW (ref 22–32)
Calcium: 9.1 mg/dL (ref 8.9–10.3)
Creatinine, Ser: 0.64 mg/dL (ref 0.44–1.00)
GFR calc Af Amer: >60 mL/min (ref >60)
GFR calc non Af Amer: >60 mL/min (ref >60)
Glucose, Bld: 87 mg/dL (ref 65–99)
Potassium: 3.3 mmol/L — ABNORMAL LOW (ref 3.5–5.1)
Sodium: 132 mmol/L — ABNORMAL LOW (ref 135–145)
Total Bilirubin: 0.2 mg/dL — ABNORMAL LOW (ref 0.3–1.2)
Total Protein: 7 g/dL (ref 6.5–8.1)

## 2015-08-04 LAB — CBC WITH DIFFERENTIAL/PLATELET
Basophils Absolute: 0 10*3/uL (ref 0–0.1)
Basophils Relative: 0 %
EOS ABS: 0.1 10*3/uL (ref 0–0.7)
EOS PCT: 2 %
HCT: 34.2 % — ABNORMAL LOW (ref 35.0–47.0)
Hemoglobin: 11.6 g/dL — ABNORMAL LOW (ref 12.0–16.0)
LYMPHS PCT: 26 %
Lymphs Abs: 1.9 10*3/uL (ref 1.0–3.6)
MCH: 26 pg (ref 26.0–34.0)
MCHC: 33.9 g/dL (ref 32.0–36.0)
MCV: 76.8 fL — AB (ref 80.0–100.0)
MONOS PCT: 7 %
Monocytes Absolute: 0.5 10*3/uL (ref 0.2–0.9)
Neutro Abs: 4.9 10*3/uL (ref 1.4–6.5)
Neutrophils Relative %: 65 %
PLATELETS: 245 10*3/uL (ref 150–440)
RBC: 4.46 MIL/uL (ref 3.80–5.20)
RDW: 15.8 % — ABNORMAL HIGH (ref 11.5–14.5)
WBC: 7.5 10*3/uL (ref 3.6–11.0)

## 2015-08-04 LAB — URINALYSIS COMPLETE WITH MICROSCOPIC (ARMC ONLY)
Bilirubin Urine: NEGATIVE
Glucose, UA: NEGATIVE mg/dL
Hgb urine dipstick: NEGATIVE
LEUKOCYTES UA: NEGATIVE
NITRITE: NEGATIVE
PROTEIN: 100 mg/dL — AB
RBC / HPF: NONE SEEN RBC/hpf (ref 0–5)
Specific Gravity, Urine: 1.032 — ABNORMAL HIGH (ref 1.005–1.030)
pH: 6 (ref 5.0–8.0)

## 2015-08-04 LAB — WET PREP, GENITAL
CLUE CELLS WET PREP: POSITIVE — AB
Trich, Wet Prep: NONE SEEN
Yeast Wet Prep HPF POC: NONE SEEN

## 2015-08-04 LAB — CHLAMYDIA/NGC RT PCR (ARMC ONLY)
Chlamydia Tr: NOT DETECTED
N GONORRHOEAE: NOT DETECTED

## 2015-08-04 LAB — PREGNANCY, URINE: Preg Test, Ur: POSITIVE — AB

## 2015-08-04 LAB — POCT PREGNANCY, URINE: PREG TEST UR: POSITIVE — AB

## 2015-08-04 LAB — HCG, QUANTITATIVE, PREGNANCY: HCG, BETA CHAIN, QUANT, S: 16238 m[IU]/mL — AB (ref <5)

## 2015-08-04 MED ORDER — ACETAMINOPHEN 325 MG PO TABS
650.0000 mg | ORAL_TABLET | Freq: Once | ORAL | Status: AC
  Administered 2015-08-04: 650 mg via ORAL
  Filled 2015-08-04: qty 2

## 2015-08-04 MED ORDER — METRONIDAZOLE 500 MG PO TABS: 500.0000 mg | ORAL_TABLET | Freq: Two times a day (BID) | ORAL | Status: AC

## 2015-08-04 NOTE — ED Notes (Signed)
Pt brought in via ems from social services today.  Pt states passing to clots today.  Pt has abd cramping and is approx [redacted] weeks pregnant.  Treated at [facility].  No dysuria.

## 2015-08-04 NOTE — ED Provider Notes (Addendum)
[facility] Emergency Department Provider Note  ____________________________________________  Time seen: Approximately 5:34 PM  I have reviewed the triage vital signs and the nursing notes.   HISTORY  Chief Complaint Vaginal Bleeding and Abdominal Pain    HPI Kristin Sweeney is a 24 y.o. 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 She is followed by OB and she takes prenatal vitamins. She has a history of hypertension and obesity asthma area patient presents today complaining of having passed 2 small clots from her vagina earlier today. Was about the size of a quarter the other the size of a $0.05 piece There is been mild cramping since that time as well. No fever no dysuria no urinary frequency no vaginal discharge, she is not bleeding at this time. She is sure that the blood was from her vagina and not her urine.  Past Medical History  Diagnosis Date  . Hypertension   . Asthma   . Anemia   . 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   No past surgical history on file.  Current Outpatient Rx  Name  Route  Sig  Dispense  Refill  . Prenatal Multivit-Min-Fe-FA (PRENATAL VITAMINS) 0.8 MG tablet   Oral   Take 1 tablet by mouth daily.   30 tablet   0   . pyridOXINE (VITAMIN B-6) 50 MG tablet   Oral   Take 1.5 tablets (75 mg total) by mouth daily.   45 tablet   0     Allergies Review of patient's allergies indicates no known allergies.  No family history on file.  Social History Social History  Substance Use Topics  . Smoking status: Former Smoker    Types: Cigarettes  . Smokeless tobacco: None  . Alcohol Use: No    Review of Systems Constitutional: No fever/chills Eyes: No visual changes. ENT: No sore  throat. No stiff neck no neck pain Cardiovascular: Denies chest pain. Respiratory: Denies shortness of breath. Gastrointestinal:   no vomiting.  No diarrhea.  No constipation. Genitourinary: Negative for dysuria. Musculoskeletal: Negative lower extremity swelling Skin: Negative for rash. Neurological: Negative for headaches, focal weakness or numbness. 10-point ROS otherwise negative.  ____________________________________________   PHYSICAL EXAM:  VITAL SIGNS: ED Triage Vitals  Enc Vitals Group     BP 08/04/15 1535 113/69 mmHg     Pulse Rate 08/04/15 1535 82     Resp 08/04/15 1535 20     Temp 08/04/15 1535 98.1 F (36.7 C)     Temp Source 08/04/15 1535 Oral     SpO2 08/04/15 1535 100 %     Weight 08/04/15 1535 260 lb (117.935 kg)     Height 08/04/15 1535 5\' 6"  (1.676 m)     Head Cir --      Peak Flow --      Pain Score 08/04/15 1537 9     Pain Loc --      Pain Edu? --      Excl. in [place]? --     Constitutional: Alert and oriented. Well appearing and in no acute distress. Eyes: Conjunctivae are normal. PERRL. EOMI. Head: Atraumatic. Nose: No congestion/rhinnorhea. Mouth/Throat: Mucous membranes are moist.  Oropharynx non-erythematous. Neck: No stridor.   Nontender with  no meningismus Cardiovascular: Normal rate, regular rhythm. Grossly normal heart sounds.  Good peripheral circulation. Respiratory: Normal respiratory effort.  No retractions. Lungs CTAB. Gastrointestinal: Soft and rather tender in the suprapubic region, no guarding or rebound No distention. No abdominal bruits.  Back:  There is no focal tenderness or step off there is no midline tenderness there are no lesions noted. there is no CVA tenderness Pelvic exam, female chaperone present, patient with no active vaginal bleeding, scant physiologic discharge, no adnexal pain, positive for mild uterine tenderness, morbid obesity limits the exam Musculoskeletal: No lower extremity tenderness. No joint effusions, no DVT  signs strong distal pulses no edema Neurologic:  Normal speech and language. No gross focal neurologic deficits are appreciated.  Skin:  Skin is warm, dry and intact. No rash noted. Psychiatric: Mood and affect are normal. Speech and behavior are normal.  ____________________________________________   LABS (all labs ordered are listed, but only abnormal results are displayed)  Labs Reviewed  WET PREP, GENITAL - Abnormal; Notable for the following:    Clue Cells Wet Prep HPF POC POSITIVE (*)    WBC, Wet Prep HPF POC FEW (*)    All other components within normal limits  COMPREHENSIVE METABOLIC PANEL - Abnormal; Notable for the following:    Sodium 132 (*)    Potassium 3.3 (*)    CO2 21 (*)    Albumin 3.3 (*)    Total Bilirubin 0.2 (*)    All other components within normal limits  CBC WITH DIFFERENTIAL/PLATELET - Abnormal; Notable for the following:    Hemoglobin 11.6 (*)    HCT 34.2 (*)    MCV 76.8 (*)    RDW 15.8 (*)    All other components within normal limits  POCT PREGNANCY, URINE - Abnormal; Notable for the following:    Preg Test, Ur POSITIVE (*)    All other components within normal limits  CHLAMYDIA/NGC RT PCR ([facility] ONLY)  PREGNANCY, URINE  URINALYSIS COMPLETEWITH MICROSCOPIC ([facility] ONLY)  HCG, QUANTITATIVE, PREGNANCY   ____________________________________________  EKG   ____________________________________________  RADIOLOGY   ____________________________________________   PROCEDURES  Procedure(s) performed: None  Critical Care performed: None  ____________________________________________   INITIAL IMPRESSION / ASSESSMENT AND PLAN / ED COURSE  Pertinent labs & imaging results that were available during my care of the patient were reviewed by me and considered in my medical decision making (see chart for details).  Patient here with a known IUP and a complicated history of multiple miscarriages, C-section 2, neck top can the past  presents with vaginal spotting cramping today. Nothing at this time to suggest appendicitis or gallbladder disease. We will obtain an ultrasound, patient declining pain medication aside from Tylenol. Overall quite well-appearing. Rh+ by prior visit. No active bleeding at this time.  ----------------------------------------- 7:51 PM on 08/04/2015 -----------------------------------------  Patient in no acute distress awaiting discharge no evidence of bleeding here serial abdominal exams are completely benign with no abdominal tenderness return precautions and follow-up given and understood ultrasound and blood work is reassuring with treat her for possible BV. ____________________________________________   FINAL CLINICAL IMPRESSION(S) / ED DIAGNOSES  Final diagnoses:  None     Schuyler Amor, MD 08/04/15 [place], MD 08/04/15 [place], MD 08/04/15 [phone]

## 2015-09-12 ENCOUNTER — Observation Stay
Admission: EM | Admit: 2015-09-12 | Discharge: 2015-09-12 | Disposition: A | Discharge status: Home / Self Care | Payer: Medicaid Other | Attending: Obstetrics and Gynecology | Admitting: Obstetrics and Gynecology

## 2015-09-12 DIAGNOSIS — J45909 Unspecified asthma, uncomplicated: Secondary | ICD-10-CM | POA: Diagnosis not present

## 2015-09-12 DIAGNOSIS — O26892 Other specified pregnancy related conditions, second trimester: Principal | ICD-10-CM | POA: Insufficient documentation

## 2015-09-12 DIAGNOSIS — Z3A22 22 weeks gestation of pregnancy: Secondary | ICD-10-CM

## 2015-09-12 DIAGNOSIS — R109 Unspecified abdominal pain: Secondary | ICD-10-CM | POA: Diagnosis present

## 2015-09-12 DIAGNOSIS — O10912 Unspecified pre-existing hypertension complicating pregnancy, second trimester: Secondary | ICD-10-CM | POA: Diagnosis not present

## 2015-09-12 DIAGNOSIS — O0990 Supervision of high risk pregnancy, unspecified, unspecified trimester: Secondary | ICD-10-CM

## 2015-09-12 DIAGNOSIS — F32A Depression, unspecified: Secondary | ICD-10-CM | POA: Diagnosis present

## 2015-09-12 DIAGNOSIS — I10 Essential (primary) hypertension: Secondary | ICD-10-CM | POA: Diagnosis present

## 2015-09-12 DIAGNOSIS — O99212 Obesity complicating pregnancy, second trimester: Secondary | ICD-10-CM | POA: Diagnosis not present

## 2015-09-12 DIAGNOSIS — F329 Major depressive disorder, single episode, unspecified: Secondary | ICD-10-CM | POA: Diagnosis present

## 2015-09-12 HISTORY — DX: Depression, unspecified: F32.A

## 2015-09-12 HISTORY — DX: Major depressive disorder, single episode, unspecified: F32.9

## 2015-09-12 LAB — COMPREHENSIVE METABOLIC PANEL
ALK PHOS: 51 U/L (ref 38–126)
ALT: 11 U/L — ABNORMAL LOW (ref 14–54)
ANION GAP: 5 (ref 5–15)
AST: 12 U/L — ABNORMAL LOW (ref 15–41)
Albumin: 3 g/dL — ABNORMAL LOW (ref 3.5–5.0)
BILIRUBIN TOTAL: 0.3 mg/dL (ref 0.3–1.2)
BUN: 5 mg/dL — ABNORMAL LOW (ref 6–20)
CALCIUM: 8.6 mg/dL — AB (ref 8.9–10.3)
CO2: 23 mmol/L (ref 22–32)
Chloride: 108 mmol/L (ref 101–111)
Creatinine, Ser: 0.53 mg/dL (ref 0.44–1.00)
GLUCOSE: 71 mg/dL (ref 65–99)
POTASSIUM: 3.4 mmol/L — AB (ref 3.5–5.1)
Sodium: 136 mmol/L (ref 135–145)
Total Protein: 6.5 g/dL (ref 6.5–8.1)

## 2015-09-12 LAB — RAPID HIV SCREEN (HIV 1/2 AB+AG)
HIV 1/2 Antibodies: NONREACTIVE
HIV-1 P24 ANTIGEN - HIV24: NONREACTIVE

## 2015-09-12 LAB — WET PREP, GENITAL
Clue Cells Wet Prep HPF POC: NONE SEEN
Trich, Wet Prep: NONE SEEN
YEAST WET PREP: NONE SEEN

## 2015-09-12 LAB — URINALYSIS COMPLETE WITH MICROSCOPIC (ARMC ONLY)
BILIRUBIN URINE: NEGATIVE
Bacteria, UA: NONE SEEN
Glucose, UA: NEGATIVE mg/dL
Leukocytes, UA: NEGATIVE
Nitrite: NEGATIVE
PH: 6 (ref 5.0–8.0)
PROTEIN: NEGATIVE mg/dL
Specific Gravity, Urine: 1.021 (ref 1.005–1.030)

## 2015-09-12 LAB — CBC
HEMATOCRIT: 31.5 % — AB (ref 35.0–47.0)
HEMOGLOBIN: 10.6 g/dL — AB (ref 12.0–16.0)
MCH: 26.2 pg (ref 26.0–34.0)
MCHC: 33.8 g/dL (ref 32.0–36.0)
MCV: 77.7 fL — ABNORMAL LOW (ref 80.0–100.0)
Platelets: 219 10*3/uL (ref 150–440)
RBC: 4.06 MIL/uL (ref 3.80–5.20)
RDW: 14.9 % — AB (ref 11.5–14.5)
WBC: 6.9 10*3/uL (ref 3.6–11.0)

## 2015-09-12 LAB — TYPE AND SCREEN
ABO/RH(D): B POS
Antibody Screen: NEGATIVE

## 2015-09-12 LAB — CHLAMYDIA/NGC RT PCR (ARMC ONLY)
CHLAMYDIA TR: NOT DETECTED
N GONORRHOEAE: NOT DETECTED

## 2015-09-12 LAB — LIPASE, BLOOD: LIPASE: 14 U/L (ref 11–51)
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
## 2015-09-12 NOTE — OB Triage Note (Addendum)
24 y.o. female presents today with complaint of intermittent abdominal pain.  States that this started yesterday, but became worse this morning. Along with the pain she has had some nausea and three episodes of vomiting, but no diarrhea.  She has gone from cold to hot, but does not have a thermometer at home and is not sure if she has had a fever.  States that this is a 10/10 on pain scale. She states that laying down makes it better and moving makes it worse. At home has tried nothing to make it better.  Had a scant amount of spotting this morning and last night.  Both times it was less than a teaspoonful, maybe a pea size amount and bright red.  Has had none since this morning.  Recent recent sexual intercourse, last night Had some "mucus/clear stuff come out this morning".  At present texting on cell phone and then turned on side and started to fall asleep.

## 2015-09-12 NOTE — Final Progress Note (Signed)
Physician Final Progress Note  Patient ID: DEZIREE MOKRY MRN: [id number] DOB/AGE: Mar 27, 1991 24 y.o.  Admit date: 09/12/2015 Admitting provider: Will Bonnet, MD Discharge date: 09/12/2015   Admission Diagnoses:  Patient Active Problem List   Diagnosis Date Noted  . Labor and delivery, indication for care 09/12/2015  . Supervision of high risk pregnancy in second trimester 09/12/2015  . [redacted] weeks gestation of pregnancy 09/12/2015  . Abdominal pain 09/12/2015  . Asthma   . Depression   . Hypertension     Discharge Diagnoses:  Patient Active Problem List   Diagnosis Date Noted  . Labor and delivery, indication for care 09/12/2015  . Supervision of high risk pregnancy in second trimester 09/12/2015  . [redacted] weeks gestation of pregnancy 09/12/2015  . Abdominal pain 09/12/2015  . Asthma   . Depression   . Hypertension     Consults: None  Significant Findings/ Diagnostic Studies: labs: all normal  (CBC, CMP, Lipase) and microbiology: U/A negative, negative wet prep, gc chlamydia  Procedures: assessment as above, positive fetal heart tones  Discharge Condition: stable  Disposition: 01-Home or Self Care  Diet: Regular diet  Discharge Activity: Activity as tolerated     Medication List    STOP taking these medications        pyridOXINE 50 MG tablet  Commonly known as:  VITAMIN B-6      TAKE these medications        Prenatal Vitamins 0.8 MG tablet  Take 1 tablet by mouth daily.         Total time spent taking care of this patient: 45 minutes  Signed: Will Bonnet, MD 09/12/2015, 5:08 PM

## 2015-09-13 LAB — HEPATITIS B SURFACE ANTIGEN: Hepatitis B Surface Ag: NEGATIVE

## 2015-09-14 LAB — VARICELLA ZOSTER ANTIBODY, IGG: Varicella IgG: 917 index (ref >165)

## 2015-09-14 LAB — RPR: RPR Ser Ql: NONREACTIVE

## 2015-09-14 LAB — RUBELLA SCREEN: Rubella: 1.05 index (ref >0.99)

## 2015-10-19 ENCOUNTER — Observation Stay
Admission: EM | Admit: 2015-10-19 | Discharge: 2015-10-20 | Disposition: A | Discharge status: Home / Self Care | Payer: Medicaid Other | Attending: Certified Nurse Midwife | Admitting: Certified Nurse Midwife

## 2015-10-19 DIAGNOSIS — E876 Hypokalemia: Secondary | ICD-10-CM | POA: Insufficient documentation

## 2015-10-19 DIAGNOSIS — O0992 Supervision of high risk pregnancy, unspecified, second trimester: Secondary | ICD-10-CM

## 2015-10-19 DIAGNOSIS — R05 Cough: Secondary | ICD-10-CM | POA: Insufficient documentation

## 2015-10-19 DIAGNOSIS — O99212 Obesity complicating pregnancy, second trimester: Secondary | ICD-10-CM | POA: Insufficient documentation

## 2015-10-19 DIAGNOSIS — H6692 Otitis media, unspecified, left ear: Secondary | ICD-10-CM | POA: Insufficient documentation

## 2015-10-19 DIAGNOSIS — Z6841 Body Mass Index (BMI) 40.0 and over, adult: Secondary | ICD-10-CM | POA: Insufficient documentation

## 2015-10-19 DIAGNOSIS — R6889 Other general symptoms and signs: Secondary | ICD-10-CM | POA: Diagnosis present

## 2015-10-19 DIAGNOSIS — Z3A27 27 weeks gestation of pregnancy: Secondary | ICD-10-CM | POA: Diagnosis not present

## 2015-10-19 DIAGNOSIS — O26892 Other specified pregnancy related conditions, second trimester: Secondary | ICD-10-CM | POA: Diagnosis present

## 2015-10-19 DIAGNOSIS — H6121 Impacted cerumen, right ear: Secondary | ICD-10-CM | POA: Insufficient documentation

## 2015-10-19 DIAGNOSIS — O162 Unspecified maternal hypertension, second trimester: Secondary | ICD-10-CM | POA: Insufficient documentation

## 2015-10-19 DIAGNOSIS — R51 Headache: Secondary | ICD-10-CM | POA: Diagnosis not present

## 2015-10-19 DIAGNOSIS — Z3A22 22 weeks gestation of pregnancy: Secondary | ICD-10-CM

## 2015-10-19 DIAGNOSIS — O99012 Anemia complicating pregnancy, second trimester: Secondary | ICD-10-CM | POA: Insufficient documentation

## 2015-10-19 LAB — COMPREHENSIVE METABOLIC PANEL
ALBUMIN: 2.8 g/dL — AB (ref 3.5–5.0)
ALK PHOS: 71 U/L (ref 38–126)
ALT: 9 U/L — ABNORMAL LOW (ref 14–54)
ANION GAP: 7 (ref 5–15)
AST: 11 U/L — AB (ref 15–41)
BUN: 6 mg/dL (ref 6–20)
CHLORIDE: 109 mmol/L (ref 101–111)
CO2: 20 mmol/L — ABNORMAL LOW (ref 22–32)
Calcium: 8.5 mg/dL — ABNORMAL LOW (ref 8.9–10.3)
Creatinine, Ser: 0.48 mg/dL (ref 0.44–1.00)
GFR calc Af Amer: >60 mL/min (ref >60)
GFR calc non Af Amer: >60 mL/min (ref >60)
Glucose, Bld: 71 mg/dL (ref 65–99)
POTASSIUM: 3.3 mmol/L — AB (ref 3.5–5.1)
SODIUM: 136 mmol/L (ref 135–145)
TOTAL PROTEIN: 6.5 g/dL (ref 6.5–8.1)
Total Bilirubin: 0.3 mg/dL (ref 0.3–1.2)

## 2015-10-19 LAB — URINALYSIS COMPLETE WITH MICROSCOPIC (ARMC ONLY)
BACTERIA UA: NONE SEEN
BILIRUBIN URINE: NEGATIVE
Glucose, UA: NEGATIVE mg/dL
HGB URINE DIPSTICK: NEGATIVE
LEUKOCYTES UA: NEGATIVE
NITRITE: NEGATIVE
PH: 6 (ref 5.0–8.0)
Protein, ur: NEGATIVE mg/dL
RBC / HPF: NONE SEEN RBC/hpf (ref 0–5)
SPECIFIC GRAVITY, URINE: 1.027 (ref 1.005–1.030)

## 2015-10-19 LAB — CBC
HEMATOCRIT: 30.3 % — AB (ref 35.0–47.0)
Hemoglobin: 10.4 g/dL — ABNORMAL LOW (ref 12.0–16.0)
MCH: 26.6 pg (ref 26.0–34.0)
MCHC: 34.3 g/dL (ref 32.0–36.0)
MCV: 77.6 fL — AB (ref 80.0–100.0)
PLATELETS: 216 10*3/uL (ref 150–440)
RBC: 3.9 MIL/uL (ref 3.80–5.20)
RDW: 15.2 % — ABNORMAL HIGH (ref 11.5–14.5)
WBC: 6.3 10*3/uL (ref 3.6–11.0)

## 2015-10-19 LAB — INFLUENZA PANEL BY PCR (TYPE A & B)
H1N1 flu by pcr: NOT DETECTED
INFLBPCR: NEGATIVE
Influenza A By PCR: NEGATIVE

## 2015-10-19 MED ORDER — DEXTROMETHORPHAN POLISTIREX ER 30 MG/5ML PO SUER
30.0000 mg | Freq: Two times a day (BID) | ORAL | Status: DC
  Administered 2015-10-19: 30 mg via ORAL
  Filled 2015-10-19 (×4): qty 5

## 2015-10-19 MED ORDER — DM-GUAIFENESIN ER 30-600 MG PO TB12: 1.0000 | ORAL_TABLET | Freq: Two times a day (BID) | ORAL | Status: DC

## 2015-10-19 MED ORDER — LACTATED RINGERS IV BOLUS (SEPSIS)
1000.0000 mL | Freq: Once | INTRAVENOUS | Status: AC
  Administered 2015-10-19: 1000 mL via INTRAVENOUS

## 2015-10-19 MED ORDER — KCL-LACTATED RINGERS 20 MEQ/L IV SOLN: INTRAVENOUS | Status: DC

## 2015-10-19 MED ORDER — ONDANSETRON HCL 4 MG/2ML IJ SOLN
4.0000 mg | INTRAMUSCULAR | Status: DC | PRN
  Administered 2015-10-19: 4 mg via INTRAVENOUS

## 2015-10-19 MED ORDER — GUAIFENESIN ER 600 MG PO TB12
600.0000 mg | ORAL_TABLET | Freq: Two times a day (BID) | ORAL | Status: DC
  Administered 2015-10-19: 600 mg via ORAL
  Filled 2015-10-19 (×3): qty 1

## 2015-10-19 MED ORDER — PSEUDOEPHEDRINE HCL 30 MG PO TABS
30.0000 mg | ORAL_TABLET | Freq: Four times a day (QID) | ORAL | Status: DC | PRN
  Administered 2015-10-19: 30 mg via ORAL
  Filled 2015-10-19 (×2): qty 1

## 2015-10-19 MED ORDER — OXYMETAZOLINE HCL 0.05 % NA SOLN
1.0000 | Freq: Two times a day (BID) | NASAL | Status: DC
  Administered 2015-10-19 – 2015-10-20 (×2): 1 via NASAL
  Filled 2015-10-19 (×2): qty 15

## 2015-10-19 MED ORDER — KCL-LACTATED RINGERS 20 MEQ/L IV SOLN
INTRAVENOUS | Status: DC
  Filled 2015-10-19 (×2): qty 1000

## 2015-10-19 MED ORDER — OXYMETAZOLINE HCL 0.05 % NA SOLN
1.0000 | Freq: Two times a day (BID) | NASAL | Status: DC
  Filled 2015-10-19: qty 15

## 2015-10-19 MED ORDER — ONDANSETRON HCL 4 MG/2ML IJ SOLN
INTRAMUSCULAR | Status: AC
  Administered 2015-10-19: 4 mg via INTRAVENOUS
  Filled 2015-10-19: qty 2

## 2015-10-19 MED ORDER — PSEUDOEPHEDRINE HCL 30 MG PO TABS
30.0000 mg | ORAL_TABLET | Freq: Three times a day (TID) | ORAL | Status: DC | PRN
  Administered 2015-10-19 – 2015-10-20 (×2): 30 mg via ORAL
  Filled 2015-10-19 (×3): qty 1

## 2015-10-19 MED ORDER — ACETAMINOPHEN 500 MG PO TABS
1000.0000 mg | ORAL_TABLET | Freq: Four times a day (QID) | ORAL | Status: DC | PRN
  Administered 2015-10-19 – 2015-10-20 (×3): 1000 mg via ORAL
  Filled 2015-10-19 (×3): qty 2

## 2015-10-19 MED ORDER — POTASSIUM CHLORIDE 2 MEQ/ML IV SOLN
INTRAVENOUS | Status: DC
  Administered 2015-10-19: 14:00:00 via INTRAVENOUS
  Filled 2015-10-19 (×6): qty 1000

## 2015-10-19 MED ORDER — DIPHENHYDRAMINE HCL 25 MG PO CAPS: 25.0000 mg | ORAL_CAPSULE | Freq: Four times a day (QID) | ORAL | Status: DC | PRN

## 2015-10-19 NOTE — OB Triage Note (Signed)
Arrived EMS for decreased fetal movement, nausea, vomiting, headache, ear pain, body aches and fever. T max of 101 per patient last night that was reduced to 98 after 1 dose of Tylenol. Symptoms stared Thursday. Fetal movement decreased since yesterday. Reports emesis x 2 today.

## 2015-10-19 NOTE — Progress Notes (Signed)
S: Feeling better. Keeping down [employer]. No vomiting since arrived at hospital Only pain is ear ache which was helped with Sudafed and Afrin Cough seems to increase headache and ear pain. Not ready for discharge home O: BP 126/73 mmHg  Pulse 70  Temp(Src) 98.5 F (36.9 C) (Oral)  Resp 20  SpO2 74%  LMP 03/09/2015  General : talking and answering questions appropriately A: Flu like symptoms; improved P: Will add Mucinex DM for cough and Benadryl for sleep. Continue IV at 141ml/hr-will need to be moved from antecubital site Plan discharge in AM. Dalia Heading, CNM

## 2015-10-19 NOTE — Progress Notes (Signed)
Triage L&D Note  24 year old G6 P2032 with EDC=01/16/2016 by a 8 week ultrasound who  presents at 27wk2d via EMS with c/o flu-like symptoms since 5days PTA. (12/8). Five days ago began to have cough, ear pain bilaterally and frontal headache. Then vomiting began, followed by diarrhea, fever, chills and sweats, and body aches. Only thing that has stayed down since sx began is water and ice chips. Last vomited at 0830 this AM. At one point was having diarrhea 2x/hr. Last stool was this AM and "it was not as watery.  Temp to 101 last night responded to Tylenol. Has had some lower abdominal cramping, but no contractions. Had also had some spotting this morning when wiping. Reports decreased FM since yesterday. Did not have flu vaccine this year. Step daughter has been sick with similar symptoms.  P[facility]atal care at [facility] remarkable for chronic hypertension (not currently on medications), morbid obesity with BMI>40, history of Cesarean section x 2,  asthma (not on medications), sickle cell trait, and mild anemia. Was treated for otitis with Amoxicillin in August and for BV x 2. Current medications: Prenatal vitamins and Tylenol NKDA  Exam: general : in NAD, IV in left antecubital space.  Patient Vitals for the past 24 hrs:  BP Temp Temp src Pulse Resp  10/19/15 1225 (!) 145/85 mmHg - - 61 -  10/19/15 1210 (!) 141/90 mmHg - - (!) 58 -  10/19/15 1052 140/79 mmHg 99 F (37.2 C) Oral 75 16  Currently 114/75 Frontal sinus tenderness Ears: TMs slightly dull, not inflammed on left. Right TM occluded by cerumen OP: mild inflammation with small amt  Exudate on left side Neck: no LAN Lungs: CTA Heart: RRR without murmur Abdomen: BS active. No upper abdominal tenderness. Tenderness in lower abdomen and over round ligament s FHR 135 with accelerations to 150s to 160s, moderate variability.  Toco: no contractions seen Results for orders placed or performed during the hospital encounter of 10/19/15 (from the past  24 hour(s))  Comprehensive metabolic panel     Status: Abnormal   Collection Time: 10/19/15 11:48 AM  Result Value Ref Range   Sodium 136 135 - 145 mmol/L   Potassium 3.3 (L) 3.5 - 5.1 mmol/L   Chloride 109 101 - 111 mmol/L   CO2 20 (L) 22 - 32 mmol/L   Glucose, Bld 71 65 - 99 mg/dL   BUN 6 6 - 20 mg/dL   Creatinine, Ser 0.48 0.44 - 1.00 mg/dL   Calcium 8.5 (L) 8.9 - 10.3 mg/dL   Total Protein 6.5 6.5 - 8.1 g/dL   Albumin 2.8 (L) 3.5 - 5.0 g/dL   AST 11 (L) 15 - 41 U/L   ALT 9 (L) 14 - 54 U/L   Alkaline Phosphatase 71 38 - 126 U/L   Total Bilirubin 0.3 0.3 - 1.2 mg/dL   GFR calc non Af Amer >60 >60 mL/min   GFR calc Af Amer >60 >60 mL/min   Anion gap 7 5 - 15  CBC     Status: Abnormal   Collection Time: 10/19/15 11:48 AM  Result Value Ref Range   WBC 6.3 3.6 - 11.0 K/uL   RBC 3.90 3.80 - 5.20 MIL/uL   Hemoglobin 10.4 (L) 12.0 - 16.0 g/dL   HCT 30.3 (L) 35.0 - 47.0 %   MCV 77.6 (L) 80.0 - 100.0 fL   MCH 26.6 26.0 - 34.0 pg   MCHC 34.3 32.0 - 36.0 g/dL   RDW 15.2 (H) 11.5 -  14.5 %   Platelets 216 150 - 440 K/uL  Urinalysis complete, with microscopic ([facility] only)     Status: Abnormal   Collection Time: 10/19/15 12:00 PM  Result Value Ref Range   Color, Urine YELLOW (A) YELLOW   APPearance HAZY (A) CLEAR   Glucose, UA NEGATIVE NEGATIVE mg/dL   Bilirubin Urine NEGATIVE NEGATIVE   Ketones, ur 2+ (A) NEGATIVE mg/dL   Specific Gravity, Urine 1.027 1.005 - 1.030   Hgb urine dipstick NEGATIVE NEGATIVE   pH 6.0 5.0 - 8.0   Protein, ur NEGATIVE NEGATIVE mg/dL   Nitrite NEGATIVE NEGATIVE   Leukocytes, UA NEGATIVE NEGATIVE   RBC / HPF NONE SEEN 0 - 5 RBC/hpf   WBC, UA 0-5 0 - 5 WBC/hpf   Bacteria, UA NONE SEEN NONE SEEN   Squamous Epithelial / LPF 6-30 (A) NONE SEEN   Mucous PRESENT   Influenza panel by PCR (type A & B, H1N1)     Status: None   Collection Time: 10/19/15 12:00 PM  Result Value Ref Range   Influenza A By PCR NEGATIVE NEGATIVE   Influenza B By PCR NEGATIVE  NEGATIVE   H1N1 flu by pcr NOT DETECTED NOT DETECTED   A: IUP at 27 2/7 weeks with flu like symptoms Mild hypokalemia ETD Reactive NST   P: IV bolus of LR completed. Will follow with LR + 20 meq KCL at 150 ml/hr Has been able to keep down clear liquids with help of Zofran IV Will give one dose of Sudafed to try to treat ETD- but may need to use Afrin due to [facility] if BPs rise with use Tylenol for aches and pains May either discharge on Zofran/ TylenolAfrin or keep over night on [facility] unit Throat culture pending Jermine Bibbee, CNM

## 2015-10-20 ENCOUNTER — Encounter: Payer: Self-pay | Admitting: Certified Nurse Midwife

## 2015-10-20 DIAGNOSIS — O26892 Other specified pregnancy related conditions, second trimester: Secondary | ICD-10-CM | POA: Diagnosis not present

## 2015-10-20 MED ORDER — ONDANSETRON 8 MG PO TBDP: 8.0000 mg | ORAL_TABLET | Freq: Three times a day (TID) | ORAL | Status: DC | PRN

## 2015-10-20 MED ORDER — OXYMETAZOLINE HCL 0.05 % NA SOLN: 1.0000 | Freq: Two times a day (BID) | NASAL | Status: DC | PRN

## 2015-10-20 MED ORDER — DM-GUAIFENESIN ER 30-600 MG PO TB12: 1.0000 | ORAL_TABLET | Freq: Two times a day (BID) | ORAL | Status: DC

## 2015-10-20 MED ORDER — GUAIFENESIN ER 600 MG PO TB12: 600.0000 mg | ORAL_TABLET | Freq: Two times a day (BID) | ORAL | Status: DC

## 2015-10-20 MED ORDER — GUAIFENESIN ER 600 MG PO TB12
600.0000 mg | ORAL_TABLET | Freq: Two times a day (BID) | ORAL | Status: DC
  Filled 2015-10-20 (×2): qty 1

## 2015-10-20 MED ORDER — DEXTROMETHORPHAN POLISTIREX ER 30 MG/5ML PO SUER: 30.0000 mg | Freq: Two times a day (BID) | ORAL | Status: DC

## 2015-10-20 MED ORDER — DEXTROMETHORPHAN POLISTIREX ER 30 MG/5ML PO SUER
30.0000 mg | Freq: Two times a day (BID) | ORAL | Status: DC
  Filled 2015-10-20 (×2): qty 5

## 2015-10-20 NOTE — Final Progress Note (Signed)
Physician Final Progress Note  Patient ID: Kristin Sweeney MRN: [id number] DOB/AGE: 06-06-1991 24 y.o.  Admit date: 10/19/2015 Admitting provider: Gae Dry, MD Discharge date: 10/20/2015   Admission Diagnoses: Flu like symptoms IUP at 27 weeks pregnancy  Discharge Diagnoses:  Active Problems:   Flu-like symptoms  Ear pain Headache Mild hypokalemia  Consults: None  Significant Findings/ Diagnostic Studies:  Results for orders placed or performed during the hospital encounter of 10/19/15 (from the past 24 hour(s))  Comprehensive metabolic panel     Status: Abnormal   Collection Time: 10/19/15 11:48 AM  Result Value Ref Range   Sodium 136 135 - 145 mmol/L   Potassium 3.3 (L) 3.5 - 5.1 mmol/L   Chloride 109 101 - 111 mmol/L   CO2 20 (L) 22 - 32 mmol/L   Glucose, Bld 71 65 - 99 mg/dL   BUN 6 6 - 20 mg/dL   Creatinine, Ser 0.48 0.44 - 1.00 mg/dL   Calcium 8.5 (L) 8.9 - 10.3 mg/dL   Total Protein 6.5 6.5 - 8.1 g/dL   Albumin 2.8 (L) 3.5 - 5.0 g/dL   AST 11 (L) 15 - 41 U/L   ALT 9 (L) 14 - 54 U/L   Alkaline Phosphatase 71 38 - 126 U/L   Total Bilirubin 0.3 0.3 - 1.2 mg/dL   GFR calc non Af Amer >60 >60 mL/min   GFR calc Af Amer >60 >60 mL/min   Anion gap 7 5 - 15  CBC     Status: Abnormal   Collection Time: 10/19/15 11:48 AM  Result Value Ref Range   WBC 6.3 3.6 - 11.0 K/uL   RBC 3.90 3.80 - 5.20 MIL/uL   Hemoglobin 10.4 (L) 12.0 - 16.0 g/dL   HCT 30.3 (L) 35.0 - 47.0 %   MCV 77.6 (L) 80.0 - 100.0 fL   MCH 26.6 26.0 - 34.0 pg   MCHC 34.3 32.0 - 36.0 g/dL   RDW 15.2 (H) 11.5 - 14.5 %   Platelets 216 150 - 440 K/uL  Urinalysis complete, with microscopic ([facility] only)     Status: Abnormal   Collection Time: 10/19/15 12:00 PM  Result Value Ref Range   Color, Urine YELLOW (A) YELLOW   APPearance HAZY (A) CLEAR   Glucose, UA NEGATIVE NEGATIVE mg/dL   Bilirubin Urine NEGATIVE NEGATIVE   Ketones, ur 2+ (A) NEGATIVE mg/dL   Specific Gravity, Urine 1.027 1.005 -  1.030   Hgb urine dipstick NEGATIVE NEGATIVE   pH 6.0 5.0 - 8.0   Protein, ur NEGATIVE NEGATIVE mg/dL   Nitrite NEGATIVE NEGATIVE   Leukocytes, UA NEGATIVE NEGATIVE   RBC / HPF NONE SEEN 0 - 5 RBC/hpf   WBC, UA 0-5 0 - 5 WBC/hpf   Bacteria, UA NONE SEEN NONE SEEN   Squamous Epithelial / LPF 6-30 (A) NONE SEEN   Mucous PRESENT   Influenza panel by PCR (type A & B, H1N1)     Status: None   Collection Time: 10/19/15 12:00 PM  Result Value Ref Range   Influenza A By PCR NEGATIVE NEGATIVE   Influenza B By PCR NEGATIVE NEGATIVE   H1N1 flu by pcr NOT DETECTED NOT DETECTED   Patient was admitted and given intravenous hydration and IV antiemetics. KCL was added to her IV fluids as her potassium was 3.3. Patient was able to tolerate liquids then regular diet. No further emesis after arrival to hospital. Patient was also afebrile. Had serous otitis on left and cerumen  impaction on right. Sudafed, Afrin, and Tylenol provided short term relief of ear pain, but ear pain and headache would resume when medication wore off. She was referred to [facility] urgent care for further evaluation and treatment of ear pain/ cerumen impaction on discharge. Procedures: NST-which was reactive.   Discharge Condition: good  Disposition: 01-Home or Self Care  Diet: Regular diet  Discharge Activity: Activity as tolerated     Medication List    TAKE these medications        acetaminophen 325 MG tablet  Commonly known as:  TYLENOL  Take 650 mg by mouth every 6 (six) hours as needed.     oxymetazoline 0.05 % nasal spray  Commonly known as:  AFRIN  Place 1 spray into both nostrils 2 (two) times daily as needed for congestion (do not take for longer than 3 days at a time).     Prenatal Vitamins 0.8 MG tablet  Take 1 tablet by mouth daily.           Follow-up Information    Go in 3 days to follow up.   Why:  for prenatal appt at [facility]      Total time spent taking care of this patient: patient was  observed and treated for less than 24 hours  Signed: Dalia Heading 10/20/2015, 7:35 AM

## 2015-10-21 LAB — CULTURE, GROUP A STREP (THRC)

## 2015-10-23 LAB — OB RESULTS CONSOLE HIV ANTIBODY (ROUTINE TESTING): HIV: NONREACTIVE

## 2015-12-14 ENCOUNTER — Other Ambulatory Visit: Payer: Self-pay

## 2015-12-14 ENCOUNTER — Encounter: Payer: Self-pay | Admitting: *Deleted

## 2015-12-14 ENCOUNTER — Observation Stay
Admission: RE | Admit: 2015-12-14 | Discharge: 2015-12-14 | Disposition: A | Discharge status: Home / Self Care | Payer: Medicaid Other | Source: Ambulatory Visit | Attending: Certified Nurse Midwife | Admitting: Certified Nurse Midwife

## 2015-12-14 DIAGNOSIS — E669 Obesity, unspecified: Secondary | ICD-10-CM | POA: Diagnosis not present

## 2015-12-14 DIAGNOSIS — O99213 Obesity complicating pregnancy, third trimester: Secondary | ICD-10-CM | POA: Diagnosis not present

## 2015-12-14 DIAGNOSIS — O34219 Maternal care for unspecified type scar from previous cesarean delivery: Secondary | ICD-10-CM | POA: Diagnosis not present

## 2015-12-14 DIAGNOSIS — O10913 Unspecified pre-existing hypertension complicating pregnancy, third trimester: Principal | ICD-10-CM | POA: Insufficient documentation

## 2015-12-14 DIAGNOSIS — O359XX1 Maternal care for (suspected) fetal abnormality and damage, unspecified, fetus 1: Secondary | ICD-10-CM

## 2015-12-14 DIAGNOSIS — Z3A35 35 weeks gestation of pregnancy: Secondary | ICD-10-CM | POA: Insufficient documentation

## 2015-12-14 DIAGNOSIS — O288 Other abnormal findings on antenatal screening of mother: Secondary | ICD-10-CM | POA: Diagnosis present

## 2015-12-14 NOTE — Discharge Instructions (Signed)
Discharge instructions reviewed with patient, pt. Verbalized instructions, copy signed and given.  Pt. Also to follow-up with [facility].Marland Kitchenappointment has been set and given.

## 2015-12-14 NOTE — OB Triage Note (Signed)
Pt. Sent from the office - [facility], for NST.

## 2015-12-14 NOTE — Final Progress Note (Signed)
Physician Final Progress Note  Patient ID: Kristin Sweeney MRN: [id number] DOB/AGE: Feb 24, 1991 25 y.o.  Admit date: 12/14/2015 Admitting provider: Aletha Halim, MD Discharge date: 12/14/2015   Admission Diagnoses:Non reactive Non stress test Chronic hypertension Obesity Prior Cesarean section x2 Small fetal head measurements on ultrasound   Discharge Diagnoses: Reactive Non stress test Chronic hypertension Obesity Prior Cesarean sections x2 Small fetal head measurements on ultrasound  Consults: None. [facility] perinatal consult pending (unable to see today)  Significant Findings/ Diagnostic Studies: 25 year old G6 P22032 with EDC=01/16/2016 presents at 35wk2d from office for a NST after NST in office was nonreactive this AM. Had not eaten prior to her office visit. FHR here was reactive with baseline 130s and accelerations to 150, moderate variability. Mother feeling good FM after eating some breakfast.  Procedures: Non stress test  Discharge Condition: stable  Disposition: 01-Home or Self Care  Diet: as tolerated  Discharge Activity: as tolerated Follow up: in office at [facility] in 1 week for NST/AFI Our office to call patient with appt time at [facility].      Total time spent taking care of this patient: 10 minutes  Signed: Dalia Heading 12/14/2015, 11:19 AM

## 2015-12-17 ENCOUNTER — Ambulatory Visit
Admit: 2015-12-17 | Discharge: 2015-12-17 | Disposition: A | Discharge status: Home / Self Care | Payer: Medicaid Other | Attending: Obstetrics and Gynecology | Admitting: Obstetrics and Gynecology

## 2015-12-17 DIAGNOSIS — Z3A35 35 weeks gestation of pregnancy: Secondary | ICD-10-CM | POA: Diagnosis not present

## 2015-12-17 DIAGNOSIS — O359XX1 Maternal care for (suspected) fetal abnormality and damage, unspecified, fetus 1: Secondary | ICD-10-CM | POA: Diagnosis not present

## 2015-12-28 ENCOUNTER — Observation Stay
Admission: EM | Admit: 2015-12-28 | Discharge: 2015-12-28 | Disposition: A | Discharge status: Home / Self Care | Payer: Medicaid Other | Attending: Certified Nurse Midwife | Admitting: Certified Nurse Midwife

## 2015-12-28 DIAGNOSIS — E669 Obesity, unspecified: Secondary | ICD-10-CM | POA: Insufficient documentation

## 2015-12-28 DIAGNOSIS — Z6841 Body Mass Index (BMI) 40.0 and over, adult: Secondary | ICD-10-CM | POA: Insufficient documentation

## 2015-12-28 DIAGNOSIS — O99333 Smoking (tobacco) complicating pregnancy, third trimester: Secondary | ICD-10-CM | POA: Diagnosis not present

## 2015-12-28 DIAGNOSIS — O99343 Other mental disorders complicating pregnancy, third trimester: Secondary | ICD-10-CM | POA: Diagnosis not present

## 2015-12-28 DIAGNOSIS — Z3A37 37 weeks gestation of pregnancy: Secondary | ICD-10-CM | POA: Insufficient documentation

## 2015-12-28 DIAGNOSIS — J45909 Unspecified asthma, uncomplicated: Secondary | ICD-10-CM | POA: Insufficient documentation

## 2015-12-28 DIAGNOSIS — F17210 Nicotine dependence, cigarettes, uncomplicated: Secondary | ICD-10-CM | POA: Insufficient documentation

## 2015-12-28 DIAGNOSIS — O34219 Maternal care for unspecified type scar from previous cesarean delivery: Secondary | ICD-10-CM | POA: Diagnosis not present

## 2015-12-28 DIAGNOSIS — O99213 Obesity complicating pregnancy, third trimester: Secondary | ICD-10-CM | POA: Insufficient documentation

## 2015-12-28 DIAGNOSIS — O10913 Unspecified pre-existing hypertension complicating pregnancy, third trimester: Secondary | ICD-10-CM | POA: Insufficient documentation

## 2015-12-28 DIAGNOSIS — O09293 Supervision of pregnancy with other poor reproductive or obstetric history, third trimester: Secondary | ICD-10-CM | POA: Insufficient documentation

## 2015-12-28 DIAGNOSIS — O99013 Anemia complicating pregnancy, third trimester: Secondary | ICD-10-CM | POA: Insufficient documentation

## 2015-12-28 DIAGNOSIS — F319 Bipolar disorder, unspecified: Secondary | ICD-10-CM | POA: Diagnosis not present

## 2015-12-28 DIAGNOSIS — D573 Sickle-cell trait: Secondary | ICD-10-CM | POA: Insufficient documentation

## 2015-12-28 DIAGNOSIS — O99513 Diseases of the respiratory system complicating pregnancy, third trimester: Secondary | ICD-10-CM | POA: Insufficient documentation

## 2015-12-28 LAB — URINALYSIS COMPLETE WITH MICROSCOPIC (ARMC ONLY)
BILIRUBIN URINE: NEGATIVE
Bacteria, UA: NONE SEEN
Glucose, UA: NEGATIVE mg/dL
HGB URINE DIPSTICK: NEGATIVE
LEUKOCYTES UA: NEGATIVE
Nitrite: NEGATIVE
PH: 7 (ref 5.0–8.0)
PROTEIN: NEGATIVE mg/dL
RBC / HPF: NONE SEEN RBC/hpf (ref 0–5)
Specific Gravity, Urine: 1.012 (ref 1.005–1.030)

## 2015-12-28 LAB — COMPREHENSIVE METABOLIC PANEL
ALT: 11 U/L — AB (ref 14–54)
ANION GAP: 5 (ref 5–15)
AST: 12 U/L — AB (ref 15–41)
Albumin: 2.6 g/dL — ABNORMAL LOW (ref 3.5–5.0)
Alkaline Phosphatase: 121 U/L (ref 38–126)
BILIRUBIN TOTAL: 0.3 mg/dL (ref 0.3–1.2)
BUN: 6 mg/dL (ref 6–20)
CHLORIDE: 109 mmol/L (ref 101–111)
CO2: 22 mmol/L (ref 22–32)
Calcium: 8.3 mg/dL — ABNORMAL LOW (ref 8.9–10.3)
Creatinine, Ser: 0.46 mg/dL (ref 0.44–1.00)
GLUCOSE: 74 mg/dL (ref 65–99)
POTASSIUM: 3.3 mmol/L — AB (ref 3.5–5.1)
Sodium: 136 mmol/L (ref 135–145)
TOTAL PROTEIN: 6.3 g/dL — AB (ref 6.5–8.1)

## 2015-12-28 LAB — CBC
HEMATOCRIT: 27.5 % — AB (ref 35.0–47.0)
Hemoglobin: 9.9 g/dL — ABNORMAL LOW (ref 12.0–16.0)
MCH: 26.8 pg (ref 26.0–34.0)
MCHC: 35.9 g/dL (ref 32.0–36.0)
MCV: 74.5 fL — ABNORMAL LOW (ref 80.0–100.0)
PLATELETS: 192 10*3/uL (ref 150–440)
RBC: 3.7 MIL/uL — ABNORMAL LOW (ref 3.80–5.20)
RDW: 15.9 % — AB (ref 11.5–14.5)
WBC: 6.1 10*3/uL (ref 3.6–11.0)

## 2015-12-28 LAB — PROTEIN / CREATININE RATIO, URINE
Creatinine, Urine: 117 mg/dL
Protein Creatinine Ratio: 0.1 mg/mg{Cre} (ref 0.00–0.15)
TOTAL PROTEIN, URINE: 12 mg/dL

## 2015-12-28 LAB — TYPE AND SCREEN
ABO/RH(D): B POS
Antibody Screen: NEGATIVE

## 2015-12-28 MED ORDER — ACETAMINOPHEN 500 MG PO TABS
1000.0000 mg | ORAL_TABLET | Freq: Four times a day (QID) | ORAL | Status: DC | PRN
  Administered 2015-12-28: 1000 mg via ORAL
  Filled 2015-12-28: qty 2

## 2015-12-28 MED ORDER — NIFEDIPINE 10 MG PO CAPS
20.0000 mg | ORAL_CAPSULE | Freq: Once | ORAL | Status: AC
  Administered 2015-12-28: 20 mg via ORAL
  Filled 2015-12-28: qty 2

## 2015-12-28 MED ORDER — LACTATED RINGERS IV SOLN: INTRAVENOUS | Status: DC

## 2015-12-28 MED ORDER — LACTATED RINGERS IV BOLUS (SEPSIS)
500.0000 mL | Freq: Once | INTRAVENOUS | Status: AC
  Administered 2015-12-28: 500 mL via INTRAVENOUS
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
## 2015-12-28 NOTE — OB Triage Note (Signed)
Pt arrived to LDR 4 with c/o contractions, placed on monitor and oriented to room.

## 2015-12-28 NOTE — Final Progress Note (Signed)
Physician Final Progress Note  Patient ID: Kristin Sweeney MRN: [id number] DOB/AGE: 11/22/1990 25 y.o.  Admit date: 12/28/2015 Admitting provider: Malachy Mood, MD Discharge date: 12/28/2015   Admission Diagnoses: Contractions  Discharge Diagnoses:  Active Problems:   Indication for care in labor and delivery, antepartum   Consults: None Patient monitored for 6-hrs reassuring fetal surveillance, negative labs for superimposed preeclampsia, mild range BP's.  Did received on dose of nifedipine for symptomatic contractions.  No cervical change over 6-hrs of monitoring.  Follow up in place 12/31/15  Significant Findings/ Diagnostic Studies:  Results for orders placed or performed during the hospital encounter of 12/28/15 (from the past 24 hour(s))  Type and screen [place]     Status: None   Collection Time: 12/28/15 10:25 AM  Result Value Ref Range   ABO/RH(D) B POS    Antibody Screen NEG    Sample Expiration 12/31/2015   CBC     Status: Abnormal   Collection Time: 12/28/15 10:25 AM  Result Value Ref Range   WBC 6.1 3.6 - 11.0 K/uL   RBC 3.70 (L) 3.80 - 5.20 MIL/uL   Hemoglobin 9.9 (L) 12.0 - 16.0 g/dL   HCT 27.5 (L) 35.0 - 47.0 %   MCV 74.5 (L) 80.0 - 100.0 fL   MCH 26.8 26.0 - 34.0 pg   MCHC 35.9 32.0 - 36.0 g/dL   RDW 15.9 (H) 11.5 - 14.5 %   Platelets 192 150 - 440 K/uL  Comprehensive metabolic panel     Status: Abnormal   Collection Time: 12/28/15 10:25 AM  Result Value Ref Range   Sodium 136 135 - 145 mmol/L   Potassium 3.3 (L) 3.5 - 5.1 mmol/L   Chloride 109 101 - 111 mmol/L   CO2 22 22 - 32 mmol/L   Glucose, Bld 74 65 - 99 mg/dL   BUN 6 6 - 20 mg/dL   Creatinine, Ser 0.46 0.44 - 1.00 mg/dL   Calcium 8.3 (L) 8.9 - 10.3 mg/dL   Total Protein 6.3 (L) 6.5 - 8.1 g/dL   Albumin 2.6 (L) 3.5 - 5.0 g/dL   AST 12 (L) 15 - 41 U/L   ALT 11 (L) 14 - 54 U/L   Alkaline Phosphatase 121 38 - 126 U/L   Total Bilirubin 0.3 0.3 - 1.2 mg/dL   GFR  calc non Af Amer >60 >60 mL/min   GFR calc Af Amer >60 >60 mL/min   Anion gap 5 5 - 15  Protein / creatinine ratio, urine     Status: None   Collection Time: 12/28/15 10:49 AM  Result Value Ref Range   Creatinine, Urine 117 mg/dL   Total Protein, Urine 12 mg/dL   Protein Creatinine Ratio 0.10 0.00 - 0.15 mg/mg[Cre]  Urinalysis complete, with microscopic ([facility] only)     Status: Abnormal   Collection Time: 12/28/15 10:49 AM  Result Value Ref Range   Color, Urine YELLOW (A) YELLOW   APPearance CLEAR (A) CLEAR   Glucose, UA NEGATIVE NEGATIVE mg/dL   Bilirubin Urine NEGATIVE NEGATIVE   Ketones, ur TRACE (A) NEGATIVE mg/dL   Specific Gravity, Urine 1.012 1.005 - 1.030   Hgb urine dipstick NEGATIVE NEGATIVE   pH 7.0 5.0 - 8.0   Protein, ur NEGATIVE NEGATIVE mg/dL   Nitrite NEGATIVE NEGATIVE   Leukocytes, UA NEGATIVE NEGATIVE   RBC / HPF NONE SEEN 0 - 5 RBC/hpf   WBC, UA 0-5 0 - 5 WBC/hpf   Bacteria, UA  NONE SEEN NONE SEEN   Squamous Epithelial / LPF 0-5 (A) NONE SEEN   Mucous PRESENT     Procedures: NST - reactive  Discharge Condition: good  Disposition: 01-Home or Self Care  Diet: Regular diet  Discharge Activity: Activity as tolerated  Discharge Instructions    Discharge diet:  No restrictions    Complete by:  As directed      Fetal Kick Count:  Lie on our left side for one hour after a meal, and count the number of times your baby kicks.  If it is less than 5 times, get up, move around and drink some juice.  Repeat the test 30 minutes later.  If it is still less than 5 kicks in an hour, notify your doctor.    Complete by:  As directed      LABOR:  When conractions begin, you should start to time them from the beginning of one contraction to the beginning  of the next.  When contractions are 5 - 10 minutes apart or less and have been regular for at least an hour, you should call your health care provider.    Complete by:  As directed      No sexual activity restrictions     Complete by:  As directed      Notify physician for bleeding from the vagina    Complete by:  As directed      Notify physician for blurring of vision or spots before the eyes    Complete by:  As directed      Notify physician for chills or fever    Complete by:  As directed      Notify physician for fainting spells, "black outs" or loss of consciousness    Complete by:  As directed      Notify physician for increase in vaginal discharge    Complete by:  As directed      Notify physician for leaking of fluid    Complete by:  As directed      Notify physician for pain or burning when urinating    Complete by:  As directed      Notify physician for pelvic pressure (sudden increase)    Complete by:  As directed      Notify physician for severe or continued nausea or vomiting    Complete by:  As directed      Notify physician for sudden gushing of fluid from the vagina (with or without continued leaking)    Complete by:  As directed      Notify physician for sudden, constant, or occasional abdominal pain    Complete by:  As directed      Notify physician if baby moving less than usual    Complete by:  As directed             Medication List    STOP taking these medications        dextromethorphan 30 MG/5ML liquid  Commonly known as:  DELSYM     guaiFENesin 600 MG 12 hr tablet  Commonly known as:  MUCINEX     oxymetazoline 0.05 % nasal spray  Commonly known as:  AFRIN      TAKE these medications        acetaminophen 325 MG tablet  Commonly known as:  TYLENOL  Take 650 mg by mouth every 6 (six) hours as needed.     albuterol 108 (90 Base) MCG/ACT inhaler  Commonly  known as:  PROVENTIL HFA;VENTOLIN HFA  Inhale 2 puffs into the lungs every 6 (six) hours as needed for wheezing or shortness of breath.     FUSION PLUS Caps  Take by mouth.     nitrofurantoin (macrocrystal-monohydrate) 100 MG capsule  Commonly known as:  MACROBID  Take 100 mg by mouth 2 (two) times  daily. Reported on 12/28/2015     ondansetron 8 MG disintegrating tablet  Commonly known as:  ZOFRAN-ODT  Take 1 tablet (8 mg total) by mouth every 8 (eight) hours as needed for nausea or vomiting.     Prenatal Vitamins 0.8 MG tablet  Take 1 tablet by mouth daily.         Total time spent taking care of this patient: 60 minutes  Signed: Dorthula Nettles 12/28/2015, 5:58 PM

## 2015-12-28 NOTE — Discharge Instructions (Signed)
Braxton Hicks Contractions °Contractions of the uterus can occur throughout pregnancy. Contractions are not always a sign that you are in labor.  °WHAT ARE BRAXTON HICKS CONTRACTIONS?  °Contractions that occur before labor are called Braxton Hicks contractions, or false labor. Toward the end of pregnancy (32-34 weeks), these contractions can develop more often and may become more forceful. This is not true labor because these contractions do not result in opening (dilatation) and thinning of the cervix. They are sometimes difficult to tell apart from true labor because these contractions can be forceful and people have different pain tolerances. You should not feel embarrassed if you go to the hospital with false labor. Sometimes, the only way to tell if you are in true labor is for your health care provider to look for changes in the cervix. °If there are no prenatal problems or other health problems associated with the pregnancy, it is completely safe to be sent home with false labor and await the onset of true labor. °HOW CAN YOU TELL THE DIFFERENCE BETWEEN TRUE AND FALSE LABOR? °False Labor °· The contractions of false labor are usually shorter and not as hard as those of true labor.   °· The contractions are usually irregular.   °· The contractions are often felt in the front of the lower abdomen and in the groin.   °· The contractions may go away when you walk around or change positions while lying down.   °· The contractions get weaker and are shorter lasting as time goes on.   °· The contractions do not usually become progressively stronger, regular, and closer together as with true labor.   °True Labor °· Contractions in true labor last 30-70 seconds, become very regular, usually become more intense, and increase in frequency.   °· The contractions do not go away with walking.   °· The discomfort is usually felt in the top of the uterus and spreads to the lower abdomen and low back.   °· True labor can be  determined by your health care provider with an exam. This will show that the cervix is dilating and getting thinner.   °WHAT TO REMEMBER °· Keep up with your usual exercises and follow other instructions given by your health care provider.   °· Take medicines as directed by your health care provider.   °· Keep your regular prenatal appointments.   °· Eat and drink lightly if you think you are going into labor.   °· If Braxton Hicks contractions are making you uncomfortable:   °¨ Change your position from lying down or resting to walking, or from walking to resting.   °¨ Sit and rest in a tub of warm water.   °¨ Drink 2-3 glasses of water. Dehydration may cause these contractions.   °¨ Do slow and deep breathing several times an hour.   °WHEN SHOULD I SEEK IMMEDIATE MEDICAL CARE? °Seek immediate medical care if: °· Your contractions become stronger, more regular, and closer together.   °· You have fluid leaking or gushing from your vagina.   °· You have a fever.   °· You pass blood-tinged mucus.   °· You have vaginal bleeding.   °· You have continuous abdominal pain.   °· You have low back pain that you never had before.   °· You feel your baby's head pushing down and causing pelvic pressure.   °· Your baby is not moving as much as it used to.   °  °This information is not intended to replace advice given to you by your health care provider. Make sure you discuss any questions you have with your health care   provider. °  °Document Released: 10/24/2005 Document Revised: 10/29/2013 Document Reviewed: 08/05/2013 °Elsevier Interactive Patient Education ©2016 [employer] ° °

## 2015-12-29 LAB — RPR: RPR Ser Ql: NONREACTIVE

## 2015-12-29 NOTE — Progress Notes (Signed)
L&D Progress Note  S: Hungry, wants to eat. Contractions have spaced out, but are still painful after receiving one dose of procardia 20 mgm about 1.5 hours ago.   O: Blood pressures have decreased and are now normotensive 157/91, 156/97, 148/89, 148/80, 146/80, 135/73, 132/84. Pulse: 84 General: appears comfortable until she has a contractions, then grimaces and breaths thru contraction FHR: 135 with accelerations to 150s, moderate variability Toco: not picking up ctxs when she is lying on her side, but patient pressing marker when she has contractions: they are irregular: q2-11 min apart and vary ing duration and strength Cervix: unchanged: FT external os/30%/posterior/-2 to -3  Results for orders placed or performed during the hospital encounter of 12/28/15 (from the past 24 hour(s))  Type and screen [place]     Status: None   Collection Time: 12/28/15 10:25 AM  Result Value Ref Range   ABO/RH(D) B POS    Antibody Screen NEG    Sample Expiration 12/31/2015   CBC     Status: Abnormal   Collection Time: 12/28/15 10:25 AM  Result Value Ref Range   WBC 6.1 3.6 - 11.0 K/uL   RBC 3.70 (L) 3.80 - 5.20 MIL/uL   Hemoglobin 9.9 (L) 12.0 - 16.0 g/dL   HCT 27.5 (L) 35.0 - 47.0 %   MCV 74.5 (L) 80.0 - 100.0 fL   MCH 26.8 26.0 - 34.0 pg   MCHC 35.9 32.0 - 36.0 g/dL   RDW 15.9 (H) 11.5 - 14.5 %   Platelets 192 150 - 440 K/uL  RPR     Status: None   Collection Time: 12/28/15 10:25 AM  Result Value Ref Range   RPR Ser Ql Non Reactive Non Reactive  Comprehensive metabolic panel     Status: Abnormal   Collection Time: 12/28/15 10:25 AM  Result Value Ref Range   Sodium 136 135 - 145 mmol/L   Potassium 3.3 (L) 3.5 - 5.1 mmol/L   Chloride 109 101 - 111 mmol/L   CO2 22 22 - 32 mmol/L   Glucose, Bld 74 65 - 99 mg/dL   BUN 6 6 - 20 mg/dL   Creatinine, Ser 0.46 0.44 - 1.00 mg/dL   Calcium 8.3 (L) 8.9 - 10.3 mg/dL   Total Protein 6.3 (L) 6.5 - 8.1 g/dL   Albumin 2.6 (L)  3.5 - 5.0 g/dL   AST 12 (L) 15 - 41 U/L   ALT 11 (L) 14 - 54 U/L   Alkaline Phosphatase 121 38 - 126 U/L   Total Bilirubin 0.3 0.3 - 1.2 mg/dL   GFR calc non Af Amer >60 >60 mL/min   GFR calc Af Amer >60 >60 mL/min   Anion gap 5 5 - 15  Protein / creatinine ratio, urine     Status: None   Collection Time: 12/28/15 10:49 AM  Result Value Ref Range   Creatinine, Urine 117 mg/dL   Total Protein, Urine 12 mg/dL   Protein Creatinine Ratio 0.10 0.00 - 0.15 mg/mg[Cre]  Urinalysis complete, with microscopic ([facility] only)     Status: Abnormal   Collection Time: 12/28/15 10:49 AM  Result Value Ref Range   Color, Urine YELLOW (A) YELLOW   APPearance CLEAR (A) CLEAR   Glucose, UA NEGATIVE NEGATIVE mg/dL   Bilirubin Urine NEGATIVE NEGATIVE   Ketones, ur TRACE (A) NEGATIVE mg/dL   Specific Gravity, Urine 1.012 1.005 - 1.030   Hgb urine dipstick NEGATIVE NEGATIVE   pH 7.0 5.0 - 8.0  Protein, ur NEGATIVE NEGATIVE mg/dL   Nitrite NEGATIVE NEGATIVE   Leukocytes, UA NEGATIVE NEGATIVE   RBC / HPF NONE SEEN 0 - 5 RBC/hpf   WBC, UA 0-5 0 - 5 WBC/hpf   Bacteria, UA NONE SEEN NONE SEEN   Squamous Epithelial / LPF 0-5 (A) NONE SEEN   Mucous PRESENT    A: IUP at 37.2 weeks with contractions-no cervical change CHTN-No evidence of preeclampsia. Increased blood pressures initially with pain/anxiety P: clear liquids Continue to observe. Dr Georgianne Fick will reevaluate after 5 PM

## 2016-01-04 ENCOUNTER — Observation Stay
Admission: EM | Admit: 2016-01-04 | Discharge: 2016-01-04 | Disposition: A | Discharge status: Home / Self Care | Payer: Medicaid Other | Attending: Obstetrics and Gynecology | Admitting: Obstetrics and Gynecology

## 2016-01-04 DIAGNOSIS — O99213 Obesity complicating pregnancy, third trimester: Secondary | ICD-10-CM | POA: Insufficient documentation

## 2016-01-04 DIAGNOSIS — E669 Obesity, unspecified: Secondary | ICD-10-CM | POA: Insufficient documentation

## 2016-01-04 DIAGNOSIS — O26899 Other specified pregnancy related conditions, unspecified trimester: Secondary | ICD-10-CM | POA: Diagnosis present

## 2016-01-04 DIAGNOSIS — Z6841 Body Mass Index (BMI) 40.0 and over, adult: Secondary | ICD-10-CM | POA: Diagnosis not present

## 2016-01-04 DIAGNOSIS — O26893 Other specified pregnancy related conditions, third trimester: Secondary | ICD-10-CM | POA: Diagnosis present

## 2016-01-04 DIAGNOSIS — F17210 Nicotine dependence, cigarettes, uncomplicated: Secondary | ICD-10-CM | POA: Diagnosis not present

## 2016-01-04 DIAGNOSIS — Z3A38 38 weeks gestation of pregnancy: Secondary | ICD-10-CM | POA: Diagnosis not present

## 2016-01-04 DIAGNOSIS — O34219 Maternal care for unspecified type scar from previous cesarean delivery: Secondary | ICD-10-CM | POA: Insufficient documentation

## 2016-01-04 DIAGNOSIS — O99333 Smoking (tobacco) complicating pregnancy, third trimester: Secondary | ICD-10-CM | POA: Diagnosis not present

## 2016-01-04 DIAGNOSIS — R519 Headache, unspecified: Secondary | ICD-10-CM | POA: Diagnosis present

## 2016-01-04 DIAGNOSIS — R51 Headache: Secondary | ICD-10-CM | POA: Diagnosis not present

## 2016-01-04 DIAGNOSIS — O10913 Unspecified pre-existing hypertension complicating pregnancy, third trimester: Secondary | ICD-10-CM | POA: Diagnosis not present

## 2016-01-04 DIAGNOSIS — J45909 Unspecified asthma, uncomplicated: Secondary | ICD-10-CM | POA: Diagnosis not present

## 2016-01-04 DIAGNOSIS — O99513 Diseases of the respiratory system complicating pregnancy, third trimester: Secondary | ICD-10-CM | POA: Insufficient documentation

## 2016-01-04 LAB — COMPREHENSIVE METABOLIC PANEL
ALT: 12 U/L — ABNORMAL LOW (ref 14–54)
ANION GAP: 8 (ref 5–15)
AST: 13 U/L — ABNORMAL LOW (ref 15–41)
Albumin: 2.8 g/dL — ABNORMAL LOW (ref 3.5–5.0)
Alkaline Phosphatase: 136 U/L — ABNORMAL HIGH (ref 38–126)
BUN: 6 mg/dL (ref 6–20)
CALCIUM: 8.7 mg/dL — AB (ref 8.9–10.3)
CHLORIDE: 105 mmol/L (ref 101–111)
CO2: 22 mmol/L (ref 22–32)
CREATININE: 0.5 mg/dL (ref 0.44–1.00)
GLUCOSE: 72 mg/dL (ref 65–99)
POTASSIUM: 3.7 mmol/L (ref 3.5–5.1)
Sodium: 135 mmol/L (ref 135–145)
Total Bilirubin: 0.5 mg/dL (ref 0.3–1.2)
Total Protein: 6.4 g/dL — ABNORMAL LOW (ref 6.5–8.1)

## 2016-01-04 LAB — CBC
HCT: 30 % — ABNORMAL LOW (ref 35.0–47.0)
Hemoglobin: 10.4 g/dL — ABNORMAL LOW (ref 12.0–16.0)
MCH: 26.1 pg (ref 26.0–34.0)
MCHC: 34.8 g/dL (ref 32.0–36.0)
MCV: 75 fL — AB (ref 80.0–100.0)
PLATELETS: 203 10*3/uL (ref 150–440)
RBC: 4 MIL/uL (ref 3.80–5.20)
RDW: 16 % — ABNORMAL HIGH (ref 11.5–14.5)
WBC: 7.1 10*3/uL (ref 3.6–11.0)

## 2016-01-04 LAB — TYPE AND SCREEN
ABO/RH(D): B POS
ANTIBODY SCREEN: NEGATIVE

## 2016-01-04 LAB — PROTEIN / CREATININE RATIO, URINE
Creatinine, Urine: 130 mg/dL
PROTEIN CREATININE RATIO: 0.13 mg/mg{creat} (ref 0.00–0.15)
TOTAL PROTEIN, URINE: 17 mg/dL

## 2016-01-04 MED ORDER — PROCHLORPERAZINE EDISYLATE 5 MG/ML IJ SOLN
10.0000 mg | Freq: Four times a day (QID) | INTRAMUSCULAR | Status: DC | PRN
  Administered 2016-01-04: 10 mg via INTRAVENOUS
  Filled 2016-01-04 (×2): qty 2

## 2016-01-04 MED ORDER — LABETALOL HCL 5 MG/ML IV SOLN
20.0000 mg | INTRAVENOUS | Status: AC
  Administered 2016-01-04: 20 mg via INTRAVENOUS

## 2016-01-04 MED ORDER — BUTALBITAL-APAP-CAFFEINE 50-325-40 MG PO TABS
ORAL_TABLET | ORAL | Status: AC
  Administered 2016-01-04: 2 via ORAL
  Filled 2016-01-04: qty 2

## 2016-01-04 MED ORDER — SODIUM CHLORIDE FLUSH 0.9 % IV SOLN
INTRAVENOUS | Status: AC
  Filled 2016-01-04: qty 10

## 2016-01-04 MED ORDER — BUTALBITAL-APAP-CAFFEINE 50-325-40 MG PO TABS
2.0000 | ORAL_TABLET | Freq: Four times a day (QID) | ORAL | Status: DC | PRN
  Administered 2016-01-04: 2 via ORAL

## 2016-01-04 MED ORDER — ACETAMINOPHEN 325 MG PO TABS: 650.0000 mg | ORAL_TABLET | ORAL | Status: DC | PRN

## 2016-01-04 MED ORDER — DIPHENHYDRAMINE HCL 50 MG/ML IJ SOLN
25.0000 mg | Freq: Four times a day (QID) | INTRAMUSCULAR | Status: DC | PRN
Start: 2016-01-04 — End: 2016-01-04
  Administered 2016-01-04: 25 mg via INTRAVENOUS

## 2016-01-04 MED ORDER — LABETALOL HCL 100 MG PO TABS: 100.0000 mg | ORAL_TABLET | Freq: Two times a day (BID) | ORAL | Status: DC

## 2016-01-04 MED ORDER — LACTATED RINGERS IV SOLN
INTRAVENOUS | Status: DC
Start: 2016-01-04 — End: 2016-01-04
  Administered 2016-01-04: 125 mL/h via INTRAVENOUS

## 2016-01-04 MED ORDER — LABETALOL HCL 5 MG/ML IV SOLN
INTRAVENOUS | Status: AC
  Administered 2016-01-04: 20 mg via INTRAVENOUS
  Filled 2016-01-04: qty 4

## 2016-01-04 MED ORDER — DIPHENHYDRAMINE HCL 50 MG/ML IJ SOLN
INTRAMUSCULAR | Status: AC
  Administered 2016-01-04: 25 mg via INTRAVENOUS
  Filled 2016-01-04: qty 1

## 2016-01-04 NOTE — Progress Notes (Signed)
Patient with new-onset headache last night, not relieved by Tylenol.  She was given fioricet earlier today that helped for about one hour.  She received a dose of compazine/benadryl that did help for a little while. She says it is starting again.  She denies seeing spots.  Denies RUQ pain. BP 152/95 mmHg  Pulse 76  Temp(Src) 98 F (36.7 C) (Oral)  LMP 03/09/2015  NAD  A/P: Single IV dose of labetalol to see if helps with headache.  If not, then will move to delivery for superimposed preeclampsia.   If it does help to control her BPs a little better, then will start outpatient labetalol.  Prentice Docker, MD 01/04/2016 5:49 PM
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
## 2016-01-08 ENCOUNTER — Encounter: Payer: Self-pay | Admitting: *Deleted

## 2016-01-08 ENCOUNTER — Observation Stay
Admission: EM | Admit: 2016-01-08 | Discharge: 2016-01-08 | Disposition: A | Discharge status: Home / Self Care | Payer: Medicaid Other | Attending: Obstetrics and Gynecology | Admitting: Obstetrics and Gynecology

## 2016-01-08 DIAGNOSIS — J45909 Unspecified asthma, uncomplicated: Secondary | ICD-10-CM | POA: Diagnosis not present

## 2016-01-08 DIAGNOSIS — O34219 Maternal care for unspecified type scar from previous cesarean delivery: Secondary | ICD-10-CM | POA: Diagnosis not present

## 2016-01-08 DIAGNOSIS — O99213 Obesity complicating pregnancy, third trimester: Secondary | ICD-10-CM | POA: Insufficient documentation

## 2016-01-08 DIAGNOSIS — Z87891 Personal history of nicotine dependence: Secondary | ICD-10-CM | POA: Insufficient documentation

## 2016-01-08 DIAGNOSIS — Z6841 Body Mass Index (BMI) 40.0 and over, adult: Secondary | ICD-10-CM | POA: Insufficient documentation

## 2016-01-08 DIAGNOSIS — Z9119 Patient's noncompliance with other medical treatment and regimen: Secondary | ICD-10-CM | POA: Diagnosis not present

## 2016-01-08 DIAGNOSIS — O99513 Diseases of the respiratory system complicating pregnancy, third trimester: Secondary | ICD-10-CM | POA: Insufficient documentation

## 2016-01-08 DIAGNOSIS — Z3A38 38 weeks gestation of pregnancy: Secondary | ICD-10-CM | POA: Insufficient documentation

## 2016-01-08 DIAGNOSIS — O99013 Anemia complicating pregnancy, third trimester: Secondary | ICD-10-CM | POA: Diagnosis not present

## 2016-01-08 DIAGNOSIS — D573 Sickle-cell trait: Secondary | ICD-10-CM | POA: Diagnosis not present

## 2016-01-08 DIAGNOSIS — E669 Obesity, unspecified: Secondary | ICD-10-CM | POA: Diagnosis not present

## 2016-01-08 DIAGNOSIS — Z91199 Patient's noncompliance with other medical treatment and regimen due to unspecified reason: Secondary | ICD-10-CM

## 2016-01-08 HISTORY — DX: Unspecified pre-existing hypertension complicating pregnancy, unspecified trimester: O10.919

## 2016-01-08 HISTORY — DX: Patient's noncompliance with other medical treatment and regimen: Z91.19

## 2016-01-08 HISTORY — DX: Patient's noncompliance with other medical treatment and regimen due to unspecified reason: Z91.199

## 2016-01-08 LAB — COMPREHENSIVE METABOLIC PANEL
ALBUMIN: 2.8 g/dL — AB (ref 3.5–5.0)
ALK PHOS: 133 U/L — AB (ref 38–126)
ALT: 15 U/L (ref 14–54)
ANION GAP: 7 (ref 5–15)
AST: 14 U/L — AB (ref 15–41)
BILIRUBIN TOTAL: 0.5 mg/dL (ref 0.3–1.2)
BUN: 9 mg/dL (ref 6–20)
CALCIUM: 8.6 mg/dL — AB (ref 8.9–10.3)
CO2: 22 mmol/L (ref 22–32)
CREATININE: 0.54 mg/dL (ref 0.44–1.00)
Chloride: 107 mmol/L (ref 101–111)
GFR calc Af Amer: >60 mL/min (ref >60)
GFR calc non Af Amer: >60 mL/min (ref >60)
GLUCOSE: 72 mg/dL (ref 65–99)
Potassium: 3.3 mmol/L — ABNORMAL LOW (ref 3.5–5.1)
SODIUM: 136 mmol/L (ref 135–145)
TOTAL PROTEIN: 6.5 g/dL (ref 6.5–8.1)

## 2016-01-08 LAB — URINE DRUG SCREEN, QUALITATIVE (ARMC ONLY)
Amphetamines, Ur Screen: NOT DETECTED
BARBITURATES, UR SCREEN: POSITIVE — AB
Benzodiazepine, Ur Scrn: NOT DETECTED
CANNABINOID 50 NG, UR ~~LOC~~: POSITIVE — AB
COCAINE METABOLITE, UR ~~LOC~~: NOT DETECTED
MDMA (ECSTASY) UR SCREEN: NOT DETECTED
Methadone Scn, Ur: NOT DETECTED
OPIATE, UR SCREEN: NOT DETECTED
PHENCYCLIDINE (PCP) UR S: NOT DETECTED
TRICYCLIC, UR SCREEN: NOT DETECTED

## 2016-01-08 LAB — PROTEIN / CREATININE RATIO, URINE
Creatinine, Urine: 229 mg/dL
Protein Creatinine Ratio: 0.1 mg/mg{Cre} (ref 0.00–0.15)
Total Protein, Urine: 23 mg/dL

## 2016-01-08 LAB — CBC
HCT: 30.6 % — ABNORMAL LOW (ref 35.0–47.0)
HEMOGLOBIN: 10.6 g/dL — AB (ref 12.0–16.0)
MCH: 25.9 pg — AB (ref 26.0–34.0)
MCHC: 34.6 g/dL (ref 32.0–36.0)
MCV: 74.8 fL — ABNORMAL LOW (ref 80.0–100.0)
Platelets: 199 10*3/uL (ref 150–440)
RBC: 4.08 MIL/uL (ref 3.80–5.20)
RDW: 16.4 % — AB (ref 11.5–14.5)
WBC: 7.7 10*3/uL (ref 3.6–11.0)

## 2016-01-08 LAB — TYPE AND SCREEN
ABO/RH(D): B POS
ANTIBODY SCREEN: NEGATIVE

## 2016-01-08 MED ORDER — LABETALOL HCL 100 MG PO TABS
100.0000 mg | ORAL_TABLET | Freq: Two times a day (BID) | ORAL | Status: DC
  Administered 2016-01-08: 100 mg via ORAL
  Filled 2016-01-08: qty 1

## 2016-01-08 NOTE — Discharge Summary (Signed)
Discharge instructions reviewed with patient including follow up appointments, home medications, signs of preeclampsia, labor precautions, and when to seek medical attention. All questions answered. Patient discharged home, ambulatory in stable condition.

## 2016-01-08 NOTE — Procedures (Addendum)
Biophysical profile Cephalic AFI [id number] [id number] +2 for tone, movement and breathing BPP 8/10 (-2 for non reactive NST)   Kristin Romans MD [facility]  Pager: [phone]

## 2016-01-08 NOTE — OB Triage Note (Signed)
Contractions increasing in intensity last night around 0330Am. Called EMS when "lost my mucous plug". No gush of fluid. Positive fetal movement.
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
## 2016-01-11 ENCOUNTER — Encounter
Admission: RE | Admit: 2016-01-11 | Discharge: 2016-01-11 | Disposition: A | Discharge status: Home / Self Care | Payer: Medicaid Other | Source: Ambulatory Visit | Attending: Obstetrics and Gynecology | Admitting: Obstetrics and Gynecology

## 2016-01-11 ENCOUNTER — Other Ambulatory Visit: Payer: Self-pay

## 2016-01-11 HISTORY — DX: Sickle-cell trait: D57.3

## 2016-01-11 LAB — DIFFERENTIAL
Basophils Absolute: 0 K/uL (ref 0–0.1)
Basophils Relative: 0 %
Eosinophils Absolute: 0.1 K/uL (ref 0–0.7)
Eosinophils Relative: 1 %
Lymphocytes Relative: 26 %
Lymphs Abs: 1.8 K/uL (ref 1.0–3.6)
Monocytes Absolute: 0.6 K/uL (ref 0.2–0.9)
Monocytes Relative: 9 %
Neutro Abs: 4.5 K/uL (ref 1.4–6.5)
Neutrophils Relative %: 64 %

## 2016-01-11 LAB — CBC
HCT: 30.8 % — ABNORMAL LOW (ref 35.0–47.0)
Hemoglobin: 10.5 g/dL — ABNORMAL LOW (ref 12.0–16.0)
MCH: 25.5 pg — ABNORMAL LOW (ref 26.0–34.0)
MCHC: 34 g/dL (ref 32.0–36.0)
MCV: 74.9 fL — ABNORMAL LOW (ref 80.0–100.0)
Platelets: 221 K/uL (ref 150–440)
RBC: 4.1 MIL/uL (ref 3.80–5.20)
RDW: 16.4 % — ABNORMAL HIGH (ref 11.5–14.5)
WBC: 6.9 K/uL (ref 3.6–11.0)

## 2016-01-11 LAB — TYPE AND SCREEN
ABO/RH(D): B POS
Antibody Screen: NEGATIVE
EXTEND SAMPLE REASON: UNDETERMINED

## 2016-01-11 NOTE — Patient Instructions (Signed)
  Your procedure is scheduled on: 01/12/15 Tues Report to  Labor and delivery 3rd floor hospital arrival time 5:30 am.    Remember: Instructions that are not followed completely may result in serious medical risk, up to and including death, or upon the discretion of your surgeon and anesthesiologist your surgery may need to be rescheduled.    __x__ 1. Do not eat food or drink liquids after midnight. No gum chewing or hard candies.     ____ 2. No Alcohol for 24 hours before or after surgery.   ____ 3. Bring all medications with you on the day of surgery if instructed.    _x___ 4. Notify your doctor if there is any change in your medical condition     (cold, fever, infections).     Do not wear jewelry, make-up, hairpins, clips or nail polish.  Do not wear lotions, powders, or perfumes. You may wear deodorant.  Do not shave 48 hours prior to surgery. Men may shave face and neck.  Do not bring valuables to the hospital.    [facility] is not responsible for any belongings or valuables.               Contacts, dentures or bridgework may not be worn into surgery.  Leave your suitcase in the car. After surgery it may be brought to your room.  For patients admitted to the hospital, discharge time is determined by your                treatment team.   Patients discharged the day of surgery will not be allowed to drive home.   Please read over the following fact sheets that you were given:      _x___ Take these medicines the morning of surgery with A SIP OF WATER:    1. labetalol (NORMODYNE) 100 MG tablet  2.   3.   4.  5.  6.  ____ Fleet Enema (as directed)   __x__ Use CHG Soap as directed  ____ Use inhalers on the day of surgery  ____ Stop metformin 2 days prior to surgery    ____ Take 1/2 of usual insulin dose the night before surgery and none on the morning of surgery.   ____ Stop Coumadin/Plavix/aspirin on   ____ Stop Anti-inflammatories on    ____ Stop supplements until  after surgery.    ____ Bring C-Pap to the hospital.

## 2016-01-12 ENCOUNTER — Encounter
Admission: RE | Disposition: A | Discharge status: Home / Self Care | Payer: Self-pay | Source: Ambulatory Visit | Attending: Obstetrics and Gynecology

## 2016-01-12 ENCOUNTER — Inpatient Hospital Stay: Payer: Medicaid Other | Admitting: Certified Registered Nurse Anesthetist

## 2016-01-12 ENCOUNTER — Inpatient Hospital Stay
Admission: RE | Admit: 2016-01-12 | Discharge: 2016-01-15 | DRG: 765 | Disposition: A | Discharge status: Home / Self Care | Payer: Medicaid Other | Source: Ambulatory Visit | Attending: Obstetrics and Gynecology | Admitting: Obstetrics and Gynecology

## 2016-01-12 DIAGNOSIS — Z6841 Body Mass Index (BMI) 40.0 and over, adult: Secondary | ICD-10-CM | POA: Diagnosis not present

## 2016-01-12 DIAGNOSIS — R209 Unspecified disturbances of skin sensation: Secondary | ICD-10-CM | POA: Diagnosis not present

## 2016-01-12 DIAGNOSIS — O1092 Unspecified pre-existing hypertension complicating childbirth: Secondary | ICD-10-CM | POA: Diagnosis present

## 2016-01-12 DIAGNOSIS — Z3A39 39 weeks gestation of pregnancy: Secondary | ICD-10-CM

## 2016-01-12 DIAGNOSIS — D62 Acute posthemorrhagic anemia: Secondary | ICD-10-CM | POA: Diagnosis not present

## 2016-01-12 DIAGNOSIS — O34211 Maternal care for low transverse scar from previous cesarean delivery: Principal | ICD-10-CM | POA: Diagnosis present

## 2016-01-12 DIAGNOSIS — O9081 Anemia of the puerperium: Secondary | ICD-10-CM | POA: Diagnosis not present

## 2016-01-12 LAB — HIV ANTIBODY (ROUTINE TESTING W REFLEX): HIV Screen 4th Generation wRfx: NONREACTIVE

## 2016-01-12 LAB — RPR: RPR: NONREACTIVE

## 2016-01-12 LAB — GLUCOSE, CAPILLARY: GLUCOSE-CAPILLARY: 66 mg/dL (ref 65–99)

## 2016-01-12 SURGERY — Surgical Case
Anesthesia: General | Wound class: Clean Contaminated

## 2016-01-12 MED ORDER — HYDROMORPHONE HCL 1 MG/ML IJ SOLN: 0.2500 mg | INTRAMUSCULAR | Status: DC | PRN

## 2016-01-12 MED ORDER — BUPIVACAINE HCL (PF) 0.5 % IJ SOLN
INTRAMUSCULAR | Status: DC | PRN
  Administered 2016-01-12: 10 mL

## 2016-01-12 MED ORDER — DEXTROSE 5 % IV SOLN
1.0000 ug/kg/h | INTRAVENOUS | Status: DC | PRN
Start: 2016-01-12 — End: 2016-01-15
  Filled 2016-01-12: qty 2

## 2016-01-12 MED ORDER — CITRIC ACID-SODIUM CITRATE 334-500 MG/5ML PO SOLN
ORAL | Status: AC
  Administered 2016-01-12: 30 mL via ORAL
  Filled 2016-01-12: qty 15

## 2016-01-12 MED ORDER — DIPHENHYDRAMINE HCL 25 MG PO CAPS: 25.0000 mg | ORAL_CAPSULE | ORAL | Status: DC | PRN

## 2016-01-12 MED ORDER — PROPOFOL 10 MG/ML IV BOLUS
INTRAVENOUS | Status: DC | PRN
  Administered 2016-01-12: 200 mg via INTRAVENOUS

## 2016-01-12 MED ORDER — DIBUCAINE 1 % RE OINT: 1.0000 "application " | TOPICAL_OINTMENT | RECTAL | Status: DC | PRN

## 2016-01-12 MED ORDER — KETOROLAC TROMETHAMINE 30 MG/ML IJ SOLN
30.0000 mg | Freq: Four times a day (QID) | INTRAMUSCULAR | Status: AC | PRN
  Administered 2016-01-12: 30 mg via INTRAVENOUS
  Filled 2016-01-12: qty 1

## 2016-01-12 MED ORDER — FENTANYL CITRATE (PF) 100 MCG/2ML IJ SOLN
INTRAMUSCULAR | Status: AC
  Administered 2016-01-12: 25 ug via INTRAVENOUS
  Filled 2016-01-12: qty 2

## 2016-01-12 MED ORDER — IBUPROFEN 600 MG PO TABS
600.0000 mg | ORAL_TABLET | Freq: Four times a day (QID) | ORAL | Status: DC
  Administered 2016-01-12 – 2016-01-15 (×12): 600 mg via ORAL
  Filled 2016-01-12 (×11): qty 1

## 2016-01-12 MED ORDER — LIDOCAINE HCL (CARDIAC) 20 MG/ML IV SOLN
INTRAVENOUS | Status: DC | PRN
  Administered 2016-01-12: 50 mg via INTRAVENOUS

## 2016-01-12 MED ORDER — WITCH HAZEL-GLYCERIN EX PADS: 1.0000 "application " | MEDICATED_PAD | CUTANEOUS | Status: DC | PRN

## 2016-01-12 MED ORDER — SIMETHICONE 80 MG PO CHEW
80.0000 mg | CHEWABLE_TABLET | Freq: Three times a day (TID) | ORAL | Status: DC
  Administered 2016-01-12 – 2016-01-15 (×8): 80 mg via ORAL
  Filled 2016-01-12 (×7): qty 1

## 2016-01-12 MED ORDER — MEPERIDINE HCL 25 MG/ML IJ SOLN: 6.2500 mg | INTRAMUSCULAR | Status: DC | PRN

## 2016-01-12 MED ORDER — ALBUTEROL SULFATE (2.5 MG/3ML) 0.083% IN NEBU: 2.5000 mg | INHALATION_SOLUTION | Freq: Four times a day (QID) | RESPIRATORY_TRACT | Status: DC | PRN

## 2016-01-12 MED ORDER — DIPHENHYDRAMINE HCL 25 MG PO CAPS: 25.0000 mg | ORAL_CAPSULE | Freq: Four times a day (QID) | ORAL | Status: DC | PRN

## 2016-01-12 MED ORDER — LANOLIN HYDROUS EX OINT: 1.0000 "application " | TOPICAL_OINTMENT | CUTANEOUS | Status: DC | PRN

## 2016-01-12 MED ORDER — DEXTROSE 5 % IV SOLN
3.0000 g | INTRAVENOUS | Status: AC
  Administered 2016-01-12: 3 g via INTRAVENOUS
  Filled 2016-01-12: qty 3000

## 2016-01-12 MED ORDER — ACETAMINOPHEN 325 MG PO TABS: 650.0000 mg | ORAL_TABLET | ORAL | Status: DC | PRN

## 2016-01-12 MED ORDER — PRENATAL MULTIVITAMIN CH
1.0000 | ORAL_TABLET | Freq: Every day | ORAL | Status: DC
  Administered 2016-01-12 – 2016-01-15 (×4): 1 via ORAL
  Filled 2016-01-12 (×4): qty 1

## 2016-01-12 MED ORDER — OXYTOCIN 10 UNIT/ML IJ SOLN
40.0000 [IU] | INTRAVENOUS | Status: DC | PRN
  Administered 2016-01-12 (×2): 40 [IU] via INTRAVENOUS

## 2016-01-12 MED ORDER — OXYTOCIN 10 UNIT/ML IJ SOLN
2.5000 [IU]/h | INTRAMUSCULAR | Status: AC
  Filled 2016-01-12: qty 10

## 2016-01-12 MED ORDER — KETOROLAC TROMETHAMINE 30 MG/ML IJ SOLN
30.0000 mg | Freq: Four times a day (QID) | INTRAMUSCULAR | Status: AC | PRN
Start: 2016-01-12 — End: 2016-01-13

## 2016-01-12 MED ORDER — MENTHOL 3 MG MT LOZG
1.0000 | LOZENGE | OROMUCOSAL | Status: DC | PRN
  Filled 2016-01-12: qty 9

## 2016-01-12 MED ORDER — NALOXONE HCL 0.4 MG/ML IJ SOLN: 0.4000 mg | INTRAMUSCULAR | Status: DC | PRN

## 2016-01-12 MED ORDER — ONDANSETRON HCL 4 MG/2ML IJ SOLN: 4.0000 mg | Freq: Once | INTRAMUSCULAR | Status: DC | PRN

## 2016-01-12 MED ORDER — FENTANYL CITRATE (PF) 100 MCG/2ML IJ SOLN
25.0000 ug | INTRAMUSCULAR | Status: AC | PRN
  Administered 2016-01-12 (×6): 25 ug via INTRAVENOUS

## 2016-01-12 MED ORDER — NALBUPHINE HCL 10 MG/ML IJ SOLN
5.0000 mg | INTRAMUSCULAR | Status: DC | PRN
Start: 2016-01-12 — End: 2016-01-15

## 2016-01-12 MED ORDER — SUCCINYLCHOLINE 20MG/ML (10ML) SYRINGE FOR MEDFUSION PUMP - OPTIME
INTRAMUSCULAR | Status: DC | PRN
  Administered 2016-01-12: 120 mg via INTRAVENOUS

## 2016-01-12 MED ORDER — DIPHENHYDRAMINE HCL 50 MG/ML IJ SOLN: 12.5000 mg | INTRAMUSCULAR | Status: DC | PRN

## 2016-01-12 MED ORDER — OXYCODONE-ACETAMINOPHEN 5-325 MG PO TABS
1.0000 | ORAL_TABLET | ORAL | Status: DC | PRN
  Administered 2016-01-13 – 2016-01-14 (×4): 1 via ORAL
  Filled 2016-01-12 (×4): qty 1

## 2016-01-12 MED ORDER — LACTATED RINGERS IV SOLN
INTRAVENOUS | Status: DC
  Administered 2016-01-12: 07:00:00 via INTRAVENOUS

## 2016-01-12 MED ORDER — NALBUPHINE HCL 10 MG/ML IJ SOLN
5.0000 mg | Freq: Once | INTRAMUSCULAR | Status: DC | PRN
Start: 2016-01-12 — End: 2016-01-15

## 2016-01-12 MED ORDER — NALBUPHINE HCL 10 MG/ML IJ SOLN: 5.0000 mg | Freq: Once | INTRAMUSCULAR | Status: DC | PRN

## 2016-01-12 MED ORDER — ONDANSETRON HCL 4 MG/2ML IJ SOLN: 4.0000 mg | Freq: Three times a day (TID) | INTRAMUSCULAR | Status: DC | PRN

## 2016-01-12 MED ORDER — FENTANYL CITRATE (PF) 250 MCG/5ML IJ SOLN
INTRAMUSCULAR | Status: DC | PRN
  Administered 2016-01-12 (×3): 50 ug via INTRAVENOUS
  Administered 2016-01-12: 100 ug via INTRAVENOUS

## 2016-01-12 MED ORDER — LACTATED RINGERS IV SOLN
INTRAVENOUS | Status: DC
  Administered 2016-01-12 – 2016-01-13 (×2): via INTRAVENOUS

## 2016-01-12 MED ORDER — SENNOSIDES-DOCUSATE SODIUM 8.6-50 MG PO TABS
2.0000 | ORAL_TABLET | ORAL | Status: DC
  Administered 2016-01-12 – 2016-01-14 (×2): 2 via ORAL
  Filled 2016-01-12 (×2): qty 2

## 2016-01-12 MED ORDER — SODIUM CHLORIDE 0.9% FLUSH: 3.0000 mL | INTRAVENOUS | Status: DC | PRN

## 2016-01-12 MED ORDER — ZOLPIDEM TARTRATE 5 MG PO TABS: 5.0000 mg | ORAL_TABLET | Freq: Every evening | ORAL | Status: DC | PRN

## 2016-01-12 MED ORDER — FENTANYL CITRATE (PF) 100 MCG/2ML IJ SOLN: 25.0000 ug | INTRAMUSCULAR | Status: DC | PRN

## 2016-01-12 MED ORDER — LABETALOL HCL 100 MG PO TABS
100.0000 mg | ORAL_TABLET | Freq: Two times a day (BID) | ORAL | Status: DC
  Administered 2016-01-12 – 2016-01-15 (×7): 100 mg via ORAL
  Filled 2016-01-12 (×7): qty 1

## 2016-01-12 MED ORDER — ONDANSETRON HCL 4 MG/2ML IJ SOLN
INTRAMUSCULAR | Status: DC | PRN
  Administered 2016-01-12: 4 mg via INTRAVENOUS

## 2016-01-12 MED ORDER — LACTATED RINGERS IV SOLN
INTRAVENOUS | Status: DC
  Administered 2016-01-12: 06:00:00 via INTRAVENOUS

## 2016-01-12 MED ORDER — BUPIVACAINE 0.25 % ON-Q PUMP DUAL CATH 400 ML
400.0000 mL | INJECTION | Status: DC
  Filled 2016-01-12: qty 400

## 2016-01-12 MED ORDER — OXYCODONE-ACETAMINOPHEN 5-325 MG PO TABS
2.0000 | ORAL_TABLET | ORAL | Status: DC | PRN
  Administered 2016-01-12 – 2016-01-15 (×11): 2 via ORAL
  Filled 2016-01-12 (×11): qty 2

## 2016-01-12 MED ORDER — SIMETHICONE 80 MG PO CHEW
80.0000 mg | CHEWABLE_TABLET | ORAL | Status: DC | PRN
  Administered 2016-01-12: 80 mg via ORAL
  Filled 2016-01-12: qty 1

## 2016-01-12 MED ORDER — ALBUTEROL SULFATE HFA 108 (90 BASE) MCG/ACT IN AERS: 2.0000 | INHALATION_SPRAY | Freq: Four times a day (QID) | RESPIRATORY_TRACT | Status: DC | PRN

## 2016-01-12 MED ORDER — CITRIC ACID-SODIUM CITRATE 334-500 MG/5ML PO SOLN
30.0000 mL | ORAL | Status: AC
  Administered 2016-01-12: 30 mL via ORAL

## 2016-01-12 MED ORDER — SIMETHICONE 80 MG PO CHEW
80.0000 mg | CHEWABLE_TABLET | ORAL | Status: DC
  Administered 2016-01-12 – 2016-01-14 (×2): 80 mg via ORAL
  Filled 2016-01-12 (×2): qty 1

## 2016-01-12 MED ORDER — LACTATED RINGERS IV SOLN
INTRAVENOUS | Status: DC | PRN
  Administered 2016-01-12 (×3): via INTRAVENOUS

## 2016-01-12 MED ORDER — NALBUPHINE HCL 10 MG/ML IJ SOLN: 5.0000 mg | INTRAMUSCULAR | Status: DC | PRN

## 2016-01-12 MED ORDER — FAMOTIDINE 20 MG PO TABS: 20.0000 mg | ORAL_TABLET | Freq: Once | ORAL | Status: DC

## 2016-01-12 MED ORDER — BUPIVACAINE HCL (PF) 0.5 % IJ SOLN
10.0000 mL | Freq: Once | INTRAMUSCULAR | Status: DC
  Filled 2016-01-12: qty 30

## 2016-01-12 SURGICAL SUPPLY — 25 items
BAG COUNTER SPONGE EZ (MISCELLANEOUS) ×3 IMPLANT
CANISTER SUCT 3000ML (MISCELLANEOUS) ×3 IMPLANT
CATH KIT ON-Q SILVERSOAK 5IN (CATHETERS) ×6 IMPLANT
CHLORAPREP W/TINT 26ML (MISCELLANEOUS) ×6 IMPLANT
DRSG TELFA 3X8 NADH (GAUZE/BANDAGES/DRESSINGS) ×3 IMPLANT
ELECT CAUTERY BLADE 6.4 (BLADE) IMPLANT
ELECT REM PT RETURN 9FT ADLT (ELECTROSURGICAL) ×3
ELECTRODE REM PT RTRN 9FT ADLT (ELECTROSURGICAL) ×2 IMPLANT
GAUZE SPONGE 4X4 12PLY STRL (GAUZE/BANDAGES/DRESSINGS) ×3 IMPLANT
GLOVE BIO SURGEON STRL SZ7 (GLOVE) ×12 IMPLANT
GLOVE INDICATOR 7.5 STRL GRN (GLOVE) ×12 IMPLANT
GOWN STRL REUS W/ TWL LRG LVL3 (GOWN DISPOSABLE) ×6 IMPLANT
GOWN STRL REUS W/TWL LRG LVL3 (GOWN DISPOSABLE) ×3
LIQUID BAND (GAUZE/BANDAGES/DRESSINGS) ×3 IMPLANT
NS IRRIG 1000ML POUR BTL (IV SOLUTION) ×3 IMPLANT
PACK C SECTION AR (MISCELLANEOUS) ×3 IMPLANT
PAD OB MATERNITY 4.3X12.25 (PERSONAL CARE ITEMS) ×3 IMPLANT
PAD PREP 24X41 OB/GYN DISP (PERSONAL CARE ITEMS) ×3 IMPLANT
SPONGE LAP 18X18 5 PK (GAUZE/BANDAGES/DRESSINGS) ×3 IMPLANT
STRIP CLOSURE SKIN 1/2X4 (GAUZE/BANDAGES/DRESSINGS) ×3 IMPLANT
SUT MNCRL AB 4-0 PS2 18 (SUTURE) ×3 IMPLANT
SUT PDS AB 1 TP1 96 (SUTURE) ×6 IMPLANT
SUT VIC AB 0 CTX 36 (SUTURE) ×2
SUT VIC AB 0 CTX36XBRD ANBCTRL (SUTURE) ×4 IMPLANT
SUT VIC AB 2-0 CT1 36 (SUTURE) ×3 IMPLANT

## 2016-01-12 NOTE — Anesthesia Procedure Notes (Signed)
Procedure Name: Intubation Date/Time: 01/12/2016 8:26 AM Performed by: Eliberto Ivory Pre-anesthesia Checklist: Patient identified, Patient being monitored, Timeout performed, Emergency Drugs available and Suction available Patient Re-evaluated:Patient Re-evaluated prior to inductionOxygen Delivery Method: Circle system utilized Preoxygenation: Pre-oxygenation with 100% oxygen Intubation Type: IV induction and Rapid sequence Ventilation: Mask ventilation without difficulty Laryngoscope Size: Mac and 3 Grade View: Grade I Tube type: Oral Tube size: 7.0 mm Number of attempts: 1 Airway Equipment and Method: Stylet Placement Confirmation: ETT inserted through vocal cords under direct vision,  positive ETCO2 and breath sounds checked- equal and bilateral Secured at: 21 cm Tube secured with: Tape Dental Injury: Teeth and Oropharynx as per pre-operative assessment

## 2016-01-12 NOTE — Progress Notes (Signed)
Pt instructed on baby safe practices.

## 2016-01-12 NOTE — Transfer of Care (Signed)
Immediate Anesthesia Transfer of Care Note  Patient: Kristin Sweeney  Procedure(s) Performed: Procedure(s): CESAREAN SECTION  Patient Location: PACU  Anesthesia Type:General  Level of Consciousness: Alert, Awake, Oriented  Airway & Oxygen Therapy: Patient Spontanous Breathing  Post-op Assessment: Report given to RN  Post vital signs: Reviewed and stable  Last Vitals:  Filed Vitals:   01/12/16 0724 01/12/16 0933  BP: 158/92 158/100  Pulse: 71 97  Temp:  36.4 C  Resp:  22    Complications: No apparent anesthesia complications

## 2016-01-12 NOTE — Discharge Summary (Addendum)
Obstetric Discharge Summary Reason for Admission: cesarean section Prenatal Procedures: none Intrapartum Procedures: cesarean: low cervical, transverse Postpartum Procedures: none Complications-Operative and Postpartum: none HEMOGLOBIN  Date Value Ref Range Status  01/11/2016 10.5* 12.0 - 16.0 g/dL Final   HGB  Date Value Ref Range Status  12/25/2013 10.8* 12.0-16.0 g/dL Final   HCT  Date Value Ref Range Status  01/11/2016 30.8* 35.0 - 47.0 % Final  12/25/2013 32.6* 35.0-47.0 % Final  Post-op Hct: 23.7  Physical Exam:  General: alert, appears stated age and no distress Lochia: appropriate Uterine Fundus: firm Incision: healing well DVT Evaluation: No evidence of DVT seen on physical exam. Extremities: decreased strength in left leg - able to ambulate using walker  Discharge Diagnoses: Term Pregnancy-delivered,   Discharge Information: Date: 01/15/16 Activity: pelvic rest and no lifting greater than 10lbs for 6 weeks, no driving for 2 weeks or while taking prescription narcotics  * Pt using walker d/t decreased strength in left leg, will f/u with out-pt PT (info to be given to pt per care manager) Diet: routine  Contraception: plans Nexplanon  Medications:    Medication List    TAKE these medications        acetaminophen 325 MG tablet  Commonly known as:  TYLENOL  Take 650 mg by mouth every 6 (six) hours as needed.     albuterol 108 (90 Base) MCG/ACT inhaler  Commonly known as:  PROVENTIL HFA;VENTOLIN HFA  Inhale 2 puffs into the lungs every 6 (six) hours as needed for wheezing or shortness of breath.     FUSION PLUS Caps  Take by mouth.     ibuprofen 600 MG tablet  Commonly known as:  ADVIL,MOTRIN  Take 1 tablet (600 mg total) by mouth every 6 (six) hours.     labetalol 100 MG tablet  Commonly known as:  NORMODYNE  Take 1 tablet (100 mg total) by mouth 2 (two) times daily.     oxyCODONE-acetaminophen 5-325 MG tablet  Commonly known as:  PERCOCET/ROXICET   Take 1-2 tablets by mouth every 4 (four) hours as needed (pain scale 4-7).     prenatal multivitamin Tabs tablet  Take 1 tablet by mouth daily at 12 noon.        Condition: stable Discharge to: home Follow-up Information    Follow up with Dorthula Nettles, MD. Go on 01/20/2016.   Specialty:  Obstetrics and Gynecology   Why:  incision check, staple removal   Contact information:   [place] [address] [phone]       Newborn Data: Live born female  Birth Weight: 6 lb 13 oz (3090 g) APGAR: 9, 10  Home with mother.  STAEBLER, ANDREAS M 01/12/2016, 9:30 AM   Updated 01/15/16 by T. Ann Groeneveld, CNM

## 2016-01-12 NOTE — Progress Notes (Signed)
Pt has on q pump  Pt blood pressure 179/105  Dr Rosey Bath aware of blood pressure  Wants to treat with pain medication not pressers

## 2016-01-12 NOTE — Progress Notes (Signed)
Blood pressure 164/91  Dr Rosey Bath good with   Blood pressure
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
## 2016-01-12 NOTE — Op Note (Addendum)
Preoperative Diagnosis: 1) 25 y.o. [id number] at [redacted]w[redacted]d 2) History of two prior low transverse Cesarean sections 3) Morbid Obesity BMI 40+ 4) Chronic hypertension  Postoperative Diagnosis: 1) 25 y.o. [id number] at [redacted]w[redacted]d 2) History of two prior low transverse Cesarean sections 3) Morbid Obesity BMI 40+ 4) Chronic hypertension  Operation Performed: Repeat low transverse C-section via pfannenstiel skin incision  Indiciation: History of prior C-section  Anesthesia: General - failed spinal  Primary Surgeon: Malachy Mood, MD   Assistant: Aletha Halim. MD  Preoperative Antibiotics: 3g Ancef  Estimated Blood Loss: 724mL  IV Fluids: 1531mL  Drains or Tubes: Foley to gravity drainage, ON-Q catheter system  Implants: none  Specimens Removed: none  Complications: none  Intraoperative Findings:  Normal tubes ovaries and uterus.  Delivery at [phone] resulted in the birth of a liveborn female, APGAR (1 MIN): 9   APGAR (5 MINS): 10, weight 6lbs 13oz  Patient Condition:stable  Procedure in Detail:  Patient was taken to the operating room were she was administered general anesthesia.  She was positioned in the supine position, prepped and draped in the  Usual sterile fashion.  Prior to proceeding with the case a time out was performed.  Utilizing the scalpel a pfannenstiel skin incision was made 2cm above the pubic symphysis utilizing the patient's pre-existing scar and carried down sharply to the the level of the rectus fascia.  The fascia was incised in the midline using the scalpel and then extended using mayo scissors.  The superior border of the rectus fascia was grasped with two Kocher clamps and the underlying rectus muscles were dissected of the fascia using blunt dissection.  The median raphae was incised using Mayo scissors.   The inferior border of the rectus fascia was dissected of the rectus muscles in a similar fashion.  The midline was identified, the peritoneum was entered  bluntly and expanded using manual tractions.  The uterus was noted to be in a none rotated position.  Next the bladder blade was placed retracting the bladder caudally.  A bladder flap was not created.  A low transverse incision was scored on the lower uterine segment.  The hysterotomy was entered bluntly using the operators finger.  The hysterotomy incision was extended using manual traction.  The operators hand was placed within the hysterotomy position noting the fetus to be within the OA position.  The vertex was grasped, flexed, brought to the incision, and delivered a traumatically using fundal pressure, nuchal cord x 1 reduced.  The remainder of the body delivered with ease.  The infant was suctioned, cord was clamped and cut before handing off to the awaiting neonatologist.  The placenta was delivered using manual extraction.  The uterus was exteriorized, wiped clean of clots and debris using two moist laps.  The hysterotomy was closed using a two layer closure of 0 Vicryl, with the first being a running locked, the second a vertical imbricating.  The uterus was returned to the abdomen.  The peritoneal gutters were wiped clean of clots and debris using two moist laps.  The hysterotomy incision was re-inspected noted to be hemostatic.  The rectus muscles were re-approximated in the midline using a single 2-0 Vicryl mattress stitch.  The rectus muscles were inspected noted to be hemostatic.  The superior border of the rectus fascia was grasped with a Kocher clamp.  The ON-Q trocars were then placed 4cm above the superior border of the incision and tunneled subfascially.  The introducers were removed and the catheters  were threaded through the sleeves after which the sleeves were removed.  The fascia was closed using a looped #1 PDS in a running fashion taking 1cm by 1cm bites.  The subcutaneous tissue was irrigated using warm saline, hemostasis achieved using the bovis.  The subcutaneous dead space was  greater than 3cm and was closed.  The subcutaneous dead space was obliterated by using a 53-T 0 Chromic in a running fashion.   The skin was closed using staples.  Sponge needle and instrument counts were corrects times two.  The patient tolerated the procedure well and was taken to the recovery room in stable condition.

## 2016-01-12 NOTE — Progress Notes (Signed)
Fundus at navel  firm

## 2016-01-12 NOTE — H&P (Addendum)
Date of Initial paper H&P: 01/11/2015  History reviewed, patient examined, no change in status, stable for surgery.  Proceed with repeat low transverse C-section.

## 2016-01-12 NOTE — Progress Notes (Signed)
On q pump on  Fundus firm and at navel  Small amt of bleeding noted on peri pads  No bleeding to large abd dsg

## 2016-01-13 LAB — CBC
HCT: 23.7 % — ABNORMAL LOW (ref 35.0–47.0)
Hemoglobin: 8.3 g/dL — ABNORMAL LOW (ref 12.0–16.0)
MCH: 26.3 pg (ref 26.0–34.0)
MCHC: 34.9 g/dL (ref 32.0–36.0)
MCV: 75.5 fL — ABNORMAL LOW (ref 80.0–100.0)
Platelets: 175 K/uL (ref 150–440)
RBC: 3.14 MIL/uL — ABNORMAL LOW (ref 3.80–5.20)
RDW: 16.6 % — ABNORMAL HIGH (ref 11.5–14.5)
WBC: 8.6 K/uL (ref 3.6–11.0)

## 2016-01-13 MED ORDER — NICOTINE 7 MG/24HR TD PT24
7.0000 mg | MEDICATED_PATCH | Freq: Every day | TRANSDERMAL | Status: DC
  Administered 2016-01-13: 7 mg via TRANSDERMAL
  Filled 2016-01-13 (×2): qty 1

## 2016-01-13 NOTE — Progress Notes (Signed)
  Subjective:   PostOP Day 1: doing well with PO intake, ambulating, passing gas and voiding. Pain is well controlled with PO pain meds. Wants to go out to smoke.  Objective:  Blood pressure 139/82, pulse 87, temperature 98 F (36.7 C), temperature source Oral, resp. rate 18, last menstrual period 03/09/2015, SpO2 100 %, bottlefeeding.  General: NAD Pulmonary: no increased work of breathing Abdomen: non-distended, non-tender, fundus firm at level of umbilicus, OnQ pump appears to be leaking Incision: with dressing- no s/s of infection, C/D/I Extremities: no edema, no erythema, no tenderness  Results for orders placed or performed during the hospital encounter of 01/12/16 (from the past 24 hour(s))  CBC     Status: Abnormal   Collection Time: 01/13/16  7:08 AM  Result Value Ref Range   WBC 8.6 3.6 - 11.0 K/uL   RBC 3.14 (L) 3.80 - 5.20 MIL/uL   Hemoglobin 8.3 (L) 12.0 - 16.0 g/dL   HCT 23.7 (L) 35.0 - 47.0 %   MCV 75.5 (L) 80.0 - 100.0 fL   MCH 26.3 26.0 - 34.0 pg   MCHC 34.9 32.0 - 36.0 g/dL   RDW 16.6 (H) 11.5 - 14.5 %   Platelets 175 150 - 440 K/uL     Assessment:   25 y.o. [id number] postoperativeday # 1   Plan:  1) Acute blood loss anemia - hemodynamically stable and asymptomatic - po ferrous sulfate  2) B+/Rubella Immune/Varicella Immune/TDAP UTD   3) Nicotine Patch during hospital stay  4) Bottlefeeding/Contraception: considering Nexplanon  5) Disposition: Home day 2 or 3  Dorsey Charette, CNM   This patient and plan were discussed with Dr Kenton Kingfisher 01/13/2016

## 2016-01-14 MED ORDER — DOCUSATE SODIUM 100 MG PO CAPS
100.0000 mg | ORAL_CAPSULE | Freq: Every day | ORAL | Status: DC
  Administered 2016-01-14 – 2016-01-15 (×2): 100 mg via ORAL
  Filled 2016-01-14 (×2): qty 1

## 2016-01-14 MED ORDER — FERROUS FUMARATE 324 (106 FE) MG PO TABS
1.0000 | ORAL_TABLET | Freq: Every day | ORAL | Status: DC
  Administered 2016-01-14 – 2016-01-15 (×2): 106 mg via ORAL
  Filled 2016-01-14 (×2): qty 1

## 2016-01-14 NOTE — Evaluation (Signed)
Physical Therapy Evaluation Patient Details Name: ZAHRAH CERBONE MRN: [id number] DOB: 12/24/1990 Today's Date: 01/14/2016   History of Present Illness  Pt is a 25 y.o. female with PMH of asthma, two cesarian sections, and chronic HTN during pregnancy.  Pt presented with labor and delivery.  Pt was admitted for s/p cesarian section (01-12-16).  PT was consulted due to decreased sensation, motor control and pain in L LE.    Clinical Impression  Prior to admission pt was independent.  Pt reported that the last few weeks of her pregnancy that she was using her mother-in-law's rollator for household chores.  Pt lives with fiance and son. Pt reported that she walked 9 laps yesterday (1 set of 3 and 1 set of 6) with rolling basinet.  Pt reported that her L leg was feeling "heavy" and "weak" before the walking, and afterwards her L leg became worse.  Pt reported that today she could not move her L leg very much and that the pain went from her L lower back to her L patella.  Pt was min assist for bed mobility, sit to stand with RW and ambulation for 51 feet with RW.  Pt required increased time due to pain (specifically L hip).  Pt required intermittent VC's and tactile cues for feet and walker placement at the beginning of ambulation.  Due to aforementioned function and strength deficits, pt is in need of skilled physical therapy.  It is recommended that pt is discharged to home with familial support and home health PT when medically appropriate.    Follow Up Recommendations Home health PT;Supervision/Assistance - 24 hour    Equipment Recommendations  Rolling walker with 5" wheels    Recommendations for Other Services       Precautions / Restrictions Precautions Precautions: Fall Precaution Comments: Cannot lift more than 10 pounds for the next 6 weeks and drive for the next 2 weeks. Restrictions Weight Bearing Restrictions: No      Mobility  Bed Mobility Overal bed mobility: Needs  Assistance Bed Mobility: Rolling;Sidelying to Sit;Sit to Sidelying Rolling: Min assist Sidelying to sit: Min assist     Sit to sidelying: Min assist General bed mobility comments: L LE navigation, increased time   Transfers Overall transfer level: Needs assistance Equipment used: Rolling walker (2 wheeled) Transfers: Sit to/from Stand Sit to Stand: Min assist         General transfer comment: increased time, verbal and tactile cues for hand placement   Ambulation/Gait Ambulation/Gait assistance: Min assist Ambulation Distance (Feet): 51 Feet Assistive device: Rolling walker (2 wheeled) Gait Pattern/deviations: Step-to pattern;Decreased step length - left;Decreased stance time - left Gait velocity: decreased    General Gait Details: increased effort, intermittent VC's at beginning of ambulation for foot, and walker placement .  VC's entailed increased UE weight bearing into RW, shifting weight onto R LE and straightening L knee during L stance phase to prevent knee buckling.   Pt was observed to have decreased L dorsiflexion and increased L eversion during early swing phase for L foot clearance.  Stairs            Wheelchair Mobility    Modified Rankin (Stroke Patients Only)       Balance Overall balance assessment: Needs assistance Sitting-balance support: Feet supported Sitting balance-Leahy Scale: Fair     Standing balance support: Bilateral upper extremity supported (RW) Standing balance-Leahy Scale: Fair  Pertinent Vitals/Pain Pain Assessment: 0-10 Pain Score: 9  Pain Location: L Lower back and L hip Pain Descriptors / Indicators: Aching;Constant;Discomfort;Grimacing;Guarding Pain Intervention(s): Limited activity within patient's tolerance;Monitored during session;Premedicated before session;Repositioned    Home Living Family/patient expects to be discharged to:: Private residence Living Arrangements:  Spouse/significant other;Children Available Help at Discharge: Family   Home Access: Stairs to enter Entrance Stairs-Rails: Right Entrance Stairs-Number of Steps: 3 Home Layout: One level Home Equipment: None      Prior Function Level of Independence: Independent with assistive device(s)         Comments: was using rollator to assist in chores at the end of pregnancy.     Hand Dominance        Extremity/Trunk Assessment   Upper Extremity Assessment: Overall WFL for tasks assessed           Lower Extremity Assessment: Generalized weakness;LLE deficits/detail   LLE Deficits / Details: decreased strength in L LE, painful with L hip flexion   Sensation: L1: B WNL L2: B WNL L3: B WNL L4: Increased on L compared to R L5: B WNL S1: Increased on L compared to R  S2: Decreased on L compared to R  Strength:  Hip flexion: L 2+/5 (increased pain); R: At least a 3/5 Quadriceps: B at least a 3/5 Hamstrings: B at least a 3/5 Dorsiflexion: B at least a 3/5 (L: increased eversion with dorsiflexion) *L dorsiflexion was originally a 2/5; however, after session, was at least a 3/5. Plantarflexion: B at least a 3/5  Hip Abductors: B At least a 3/5 Extensor Hallucis: L 4+/5; R at least a 3/5 (no downward force was applied on R). Ankle Eversion: L 4+/5; R at least a 3/5 (no downward force was applied on R). Ankle Inversion: L 4/5; R at least a 3/5 (no downward force was applied on R).   Cervical / Trunk Assessment: Normal  Communication   Communication: No difficulties  Cognition Arousal/Alertness: Awake/alert Behavior During Therapy: WFL for tasks assessed/performed Overall Cognitive Status: Within Functional Limits for tasks assessed                      General Comments   Nursing was contacted and cleared pt for physical therapy.  Pt was agreeable and session was modified due to fatigue and pain.  Pt's visitors were present at the end of the session.      Exercises        Assessment/Plan    PT Assessment Patient needs continued PT services  PT Diagnosis Difficulty walking   PT Problem List Decreased strength;Decreased range of motion;Decreased activity tolerance;Decreased balance;Decreased mobility;Decreased knowledge of use of DME;Pain  PT Treatment Interventions DME instruction;Gait training;Stair training;Functional mobility training;Therapeutic activities;Therapeutic exercise;Balance training;Patient/family education   PT Goals (Current goals can be found in the Care Plan section) Acute Rehab PT Goals Patient Stated Goal: to go home  PT Goal Formulation: With patient Time For Goal Achievement: 01/28/16 Potential to Achieve Goals: Good    Frequency Min 2X/week   Barriers to discharge        Co-evaluation               End of Session Equipment Utilized During Treatment: Gait belt Activity Tolerance: Patient limited by pain Patient left: in bed;with bed alarm set;with family/visitor present;with call bell/phone within reach Nurse Communication: Mobility status         Time: [id number] PT Time Calculation (min) (ACUTE ONLY): 56 min   Charges:  PT G Codes:       Mittie Bodo, SPT Mittie Bodo 01/14/2016, 4:18 PM

## 2016-01-14 NOTE — Progress Notes (Signed)
POD #2 repeat Cesarean section Subjective:   I can't move my left leg and I have pain starting in my left hip radiating to my left knee. Paresthesia of the left upper leg also present. These problems present since delivery but worse today. Had spinal then general anesthesia for failed spinal. Starting to pass gas this AM. Feeling SOB when up ambulating/hopping to BR. Denies lightheadedness. Bottle feeding  Objective:  Blood pressure 146/74, pulse 74, temperature 98.3 F (36.8 C), temperature source Oral, resp. rate 20, last menstrual period 03/09/2015, SpO2 99 %, unknown if currently breastfeeding. Has had some elevated systolics [id number] since last night. Is on labetalol 100 mgm BID  General: NAD Pulmonary: no increased work of breathing, CTA Heart:RRR with grade II/VI systolic murmur all fields Abdomen: soft, but mildly tympanic, obese,  Incision: C&D&I. ON Q catheters not taped down and have come out to the 2 mark area. Dermabond placed at bases of catheters and catheters taped securely in place. Extremities: no erythema, no tenderness; pain in left hip and knee when assisting her to bend knee. She is unable to freely move left leg. Trace edema in both LE  Results for orders placed or performed during the hospital encounter of 01/12/16 (from the past 72 hour(s))  Glucose, capillary     Status: None   Collection Time: 01/12/16  6:53 AM  Result Value Ref Range   Glucose-Capillary 66 65 - 99 mg/dL  CBC     Status: Abnormal   Collection Time: 01/13/16  7:08 AM  Result Value Ref Range   WBC 8.6 3.6 - 11.0 K/uL   RBC 3.14 (L) 3.80 - 5.20 MIL/uL   Hemoglobin 8.3 (L) 12.0 - 16.0 g/dL    Comment: RESULT REPEATED AND VERIFIED   HCT 23.7 (L) 35.0 - 47.0 %   MCV 75.5 (L) 80.0 - 100.0 fL   MCH 26.3 26.0 - 34.0 pg   MCHC 34.9 32.0 - 36.0 g/dL   RDW 16.6 (H) 11.5 - 14.5 %   Platelets 175 150 - 440 K/uL     Assessment:   25 y.o. [id number] postoperative day # 2  Sensory and motor deficit s/p  spinal anesthesia   Consult anesthesia and PT (needs walker to help with ambulation)  CHTN with some elevated systolics on labetalol   Monitor blood pressures and increase labetalol as needed.   Plan:  1.) Acute blood loss anemia - hemodynamically stable and asymptomatic -po iron and vitamins  2) --/--/B POS (03/06 1019) / RI / Varicella Immune  3) TDAP UTD  4)Bottle Kristin Sweeney, CNM

## 2016-01-14 NOTE — Anesthesia Postprocedure Evaluation (Signed)
Anesthesia Post Note  Patient: Kristin Sweeney  Procedure(s) Performed: Procedure(s): CESAREAN SECTION  Patient location during evaluation: PACU Anesthesia Type: General Level of consciousness: awake and alert Pain management: pain level controlled Vital Signs Assessment: post-procedure vital signs reviewed and stable Respiratory status: spontaneous breathing, nonlabored ventilation, respiratory function stable and patient connected to nasal cannula oxygen Cardiovascular status: blood pressure returned to baseline and stable Postop Assessment: no signs of nausea or vomiting Anesthetic complications: no    Last Vitals:  Filed Vitals:   01/14/16 1630 01/14/16 1806  BP: 164/85 151/74  Pulse: 72   Temp: 36.9 C   Resp: 18     Last Pain:  Filed Vitals:   01/14/16 1807  PainSc: 10-Worst pain ever                 Martha Clan

## 2016-01-14 NOTE — Care Management (Signed)
Received referral for "Mom + for MJ and barbituates and newborn +UDS for MJ. Mec specimen sent on baby.  Referral sent to Clinical Social Worker.  Shelbie Ammons RN MSN CCM Care Management [phone]

## 2016-01-14 NOTE — Clinical Social Work Maternal (Signed)
CLINICAL SOCIAL WORK MATERNAL/CHILD NOTE  Patient Details  Name: Kristin Sweeney MRN: [id number] Date of Birth: 1991/06/30  Date:  01/14/2016  Clinical Social Worker Initiating Note:   Blima Rich, [place] [phone]) Date/ Time Initiated:  01/14/16/1700     Child's Name:    Jenetta Downer   Legal Guardian:  Mother   Need for Interpreter:  None   Date of Referral:  01/14/16     Reason for Referral:  Current Substance Use/Substance Use During Pregnancy    Referral Source:  Case Manager   Address:   ([address], [place] )  Phone number:  [phone]   Household Members:  Minor Children, Significant Other   Natural Supports (not living in the home):  Extended Family, Immediate Family   Professional Supports: Case Metallurgist   Employment: Unemployed   Type of Work:   Unemployed   Education:  Database administrator Resources:  [employer]   Other Resources:  Physicist, medical , [place] Considerations Which May Impact Care:  N/A  Strengths:  Ability to meet basic needs , Compliance with medical plan , Home prepared for child    Risk Factors/Current Problems:  Substance Use    Cognitive State:  Alert    Mood/Affect:  Calm , Relaxed    CSW Assessment: Clinical Education officer, museum (CSW) received consult for drug exposed new born. Per RN mom's drug screen is positive for marijuana and barbiturates. Baby's urine drug screen is positive for marijuana. Baby's Meconium drug screen was sent off and is pending. CSW met with mom at bedside. Mom had several visitors and CSW asked visitors to leave the room and they complied. Mom was sitting up in the bed eating a sandwich. Mom reported that she has 2 other children 66 y.o daughter Remus Loffler that lives in [place] with her father's family and 30 y.o son Julian Reil who lives with mom in [place]. Per mom she lives in an apartment in [place] with her Homer and 24 y.o son. The plan is to  take infant Ariel to apartment as well. Per mom she is not working right now and has no income. Mom reported that as soon as she heals she will start looking for a job. Per mom Antonio does not work and receives [employer]. Mom reported that Antonio's SSI check is the only source of income for the family. Mom reported that they are getting evicted from their apartment and Myna Bright is going to court next week for late rent due. Mom reported that before they lived in the apartment they were staying at [facility] [facility] in [place]. Mom reported that she does have a car seat and basic supplies for baby. Mom reported that she has a pregnancy advocate through the health department. When asked about marijuana use mom reported that she "smokes a blunt once a week for an appetite stimulate." CSW made mom aware that baby is also positive for marijuana. Mom seemed surprised to learn that her baby was positive for marijuana and stated that she thought she had to smoke a lot for baby to be positive. CSW provided emotional support and education about drug use during pregency. Patient did not know why she was positive for barbitures. CSW made mom aware that a Child Protective Services (CPS) report will have to be made due to baby's positive drug screen. Mom reacted appropriately.   [facility]PS [facility] to [facility]. PT is recommending home health. RN Case  Manager notified. CSW provided patient with outpatient substance abuse resources. Patient reported that she she would definitely follow up with [facility] or [facility]. Please reconsult if future social work needs arise. CSW signing off.   CSW Plan/Description:  Child Protective Service Report     Elwyn Reach 01/14/2016, 5:02 PM

## 2016-01-15 MED ORDER — PRENATAL MULTIVITAMIN CH: 1.0000 | ORAL_TABLET | Freq: Every day | ORAL | Status: AC

## 2016-01-15 MED ORDER — LABETALOL HCL 100 MG PO TABS: 100.0000 mg | ORAL_TABLET | Freq: Two times a day (BID) | ORAL | Status: AC

## 2016-01-15 MED ORDER — IBUPROFEN 600 MG PO TABS: 600.0000 mg | ORAL_TABLET | Freq: Four times a day (QID) | ORAL | Status: AC

## 2016-01-15 MED ORDER — OXYCODONE-ACETAMINOPHEN 5-325 MG PO TABS
1.0000 | ORAL_TABLET | ORAL | Status: AC | PRN
Start: 2016-01-15 — End: ?

## 2016-01-15 NOTE — Discharge Instructions (Signed)
Discharge instructions:  ° °Call office if you have any of the following: headache, visual changes, fever >100 F, chills, breast concerns, excessive vaginal bleeding, incision drainage or problems, leg pain or redness, depression or any other concerns.  ° °Activity: Do not lift > 10 lbs for 6 weeks.  °No intercourse or tampons for 6 weeks.  °No driving for 1-2 weeks.  ° °

## 2016-01-15 NOTE — Care Management (Signed)
Spoke with physical therapist and Blima Rich Clinical Social Worker yesterday. Physical therapy is recommending home with therapy in the home and a rolling walker. Ms. Schnyder has Medicaid as her payer. Medicaid will not pay for therapy in the home. Will give Ms. Fasching information on [facility] and request a rolling walker from [facility]. Shelbie Ammons RN MSN CCM Care Management [phone]

## 2016-01-15 NOTE — Progress Notes (Signed)
Mom stated "baby didn't finish her bottle because she breastfeed for a little bit". I reeducated mom that she should not be breastfeeding due to her positive UDS.

## 2016-01-15 NOTE — Progress Notes (Signed)
Pt discharged home with infant.  Discharge instructions and follow up appointment given to and reviewed with pt.  Pt verbalized understanding.  Escorted by auxillary. 

## 2016-01-15 NOTE — Progress Notes (Signed)
CPS worker called Clinical Social Worker (CSW) today to inquire when patient will D/C. CSW made CPS worker aware that there is a discharge order in for patient today.   Blima Rich, LCSW [phone]

## 2016-09-04 IMAGING — CR DG FOOT COMPLETE 3+V*L*
1 series · 3 of 3 positions shown · non-contrast
Comparison: None.

CLINICAL DATA: Ankle twisting injury.  Initial encounter.

EXAM:
LEFT FOOT - COMPLETE 3+ VIEW

[Series 1: ap · 0.17mm/px · 3 of 3 slices shown]
[im 1/3]
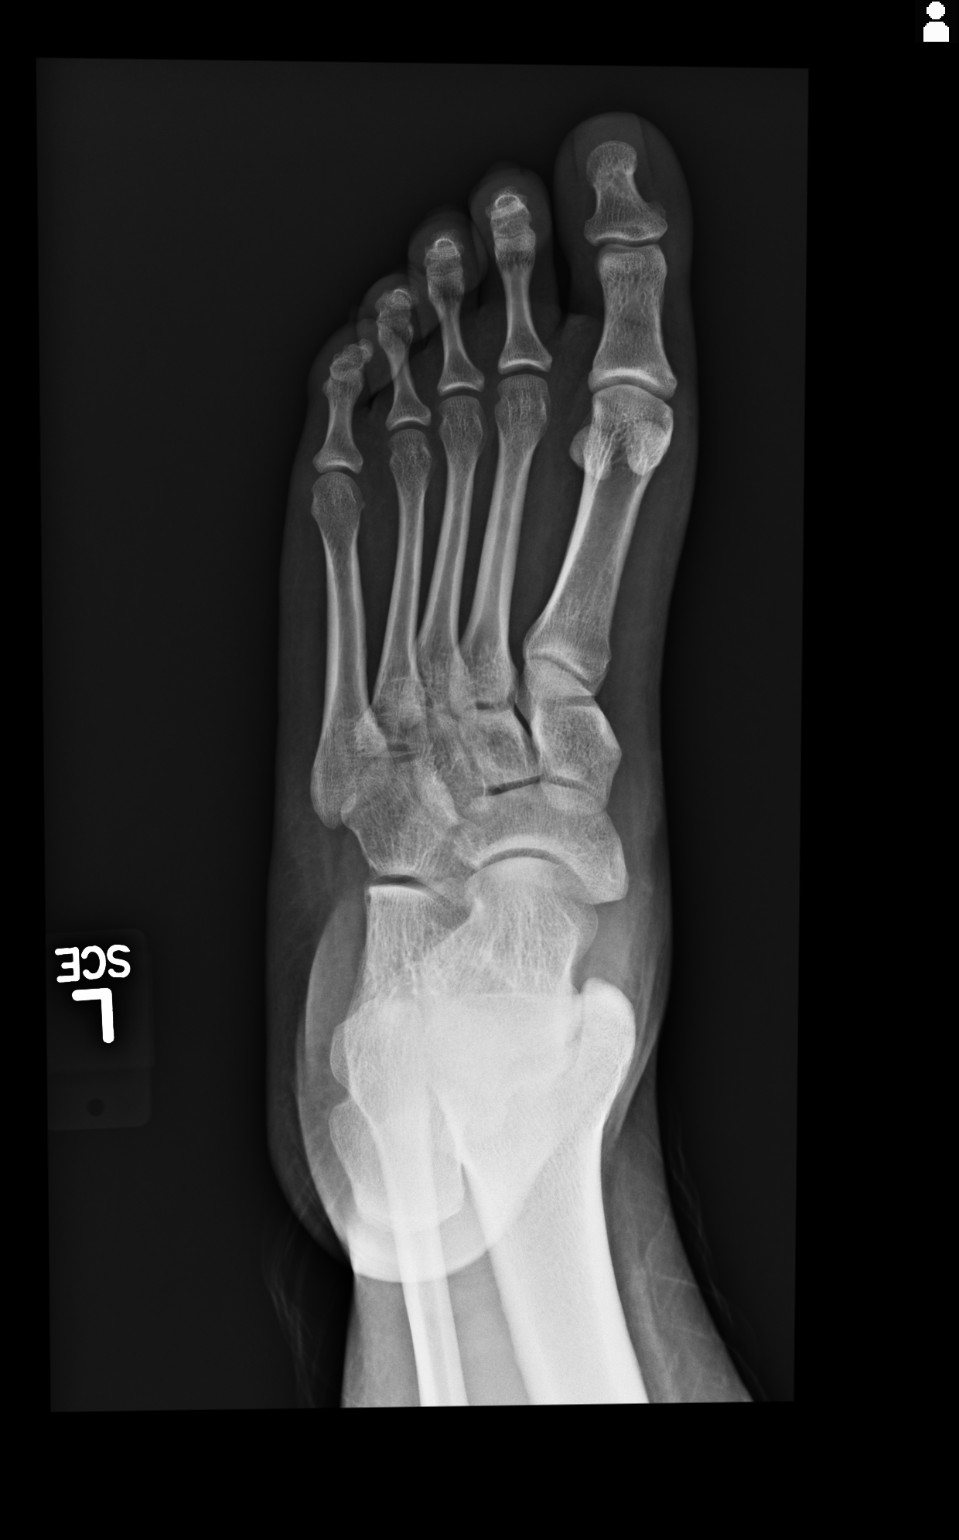
[im 2/3]
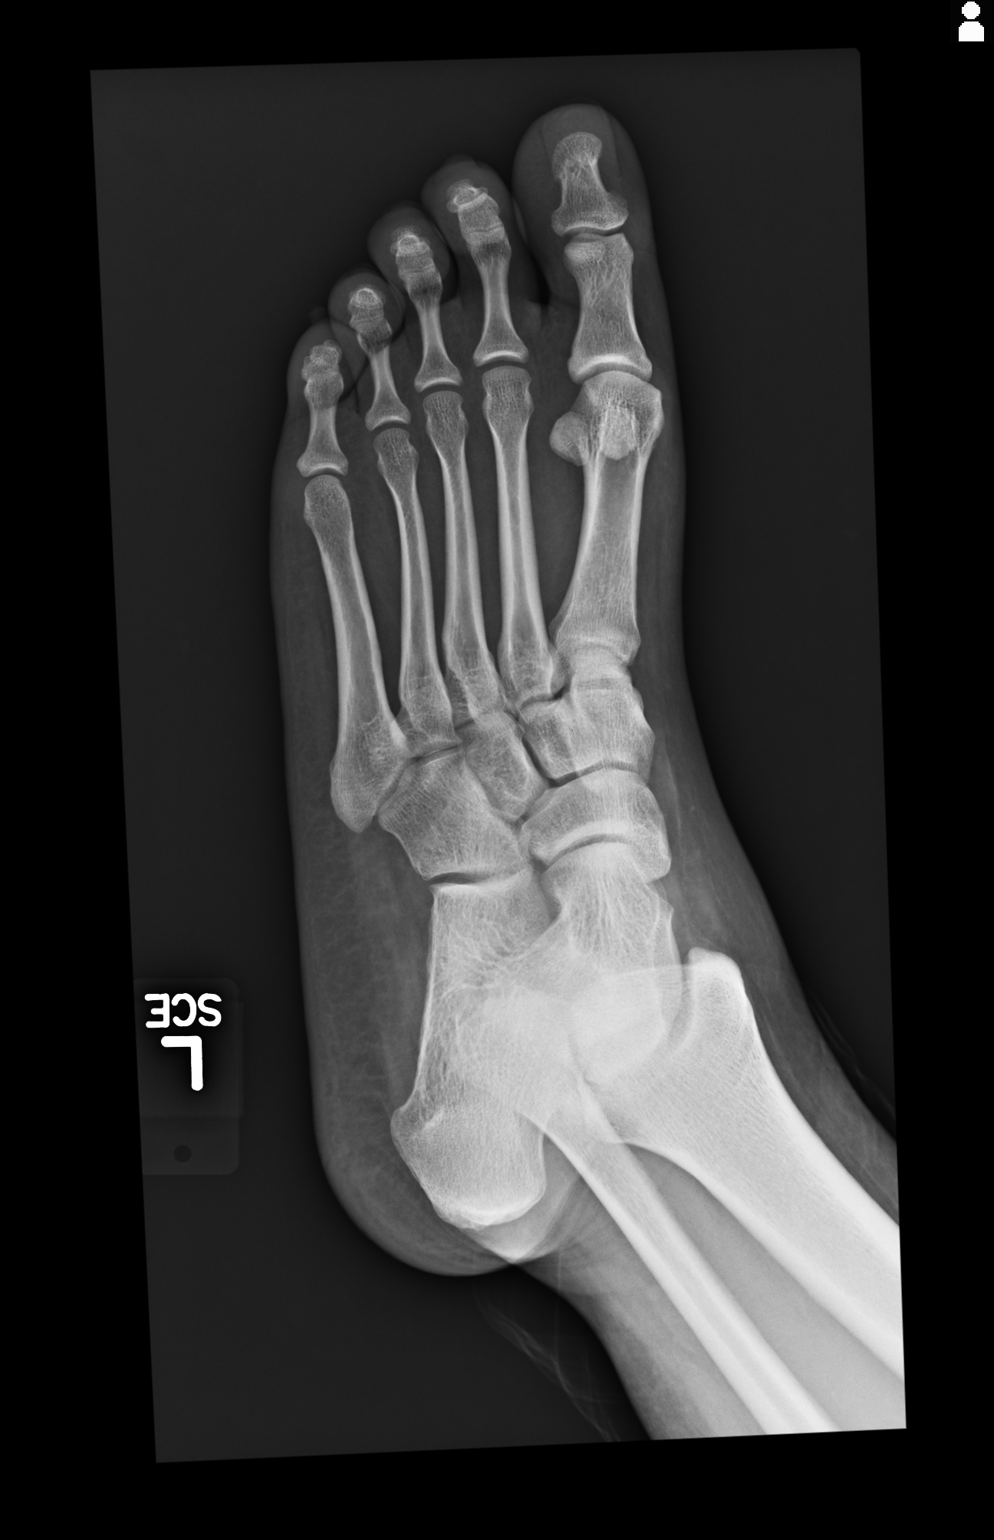
[im 3/3]
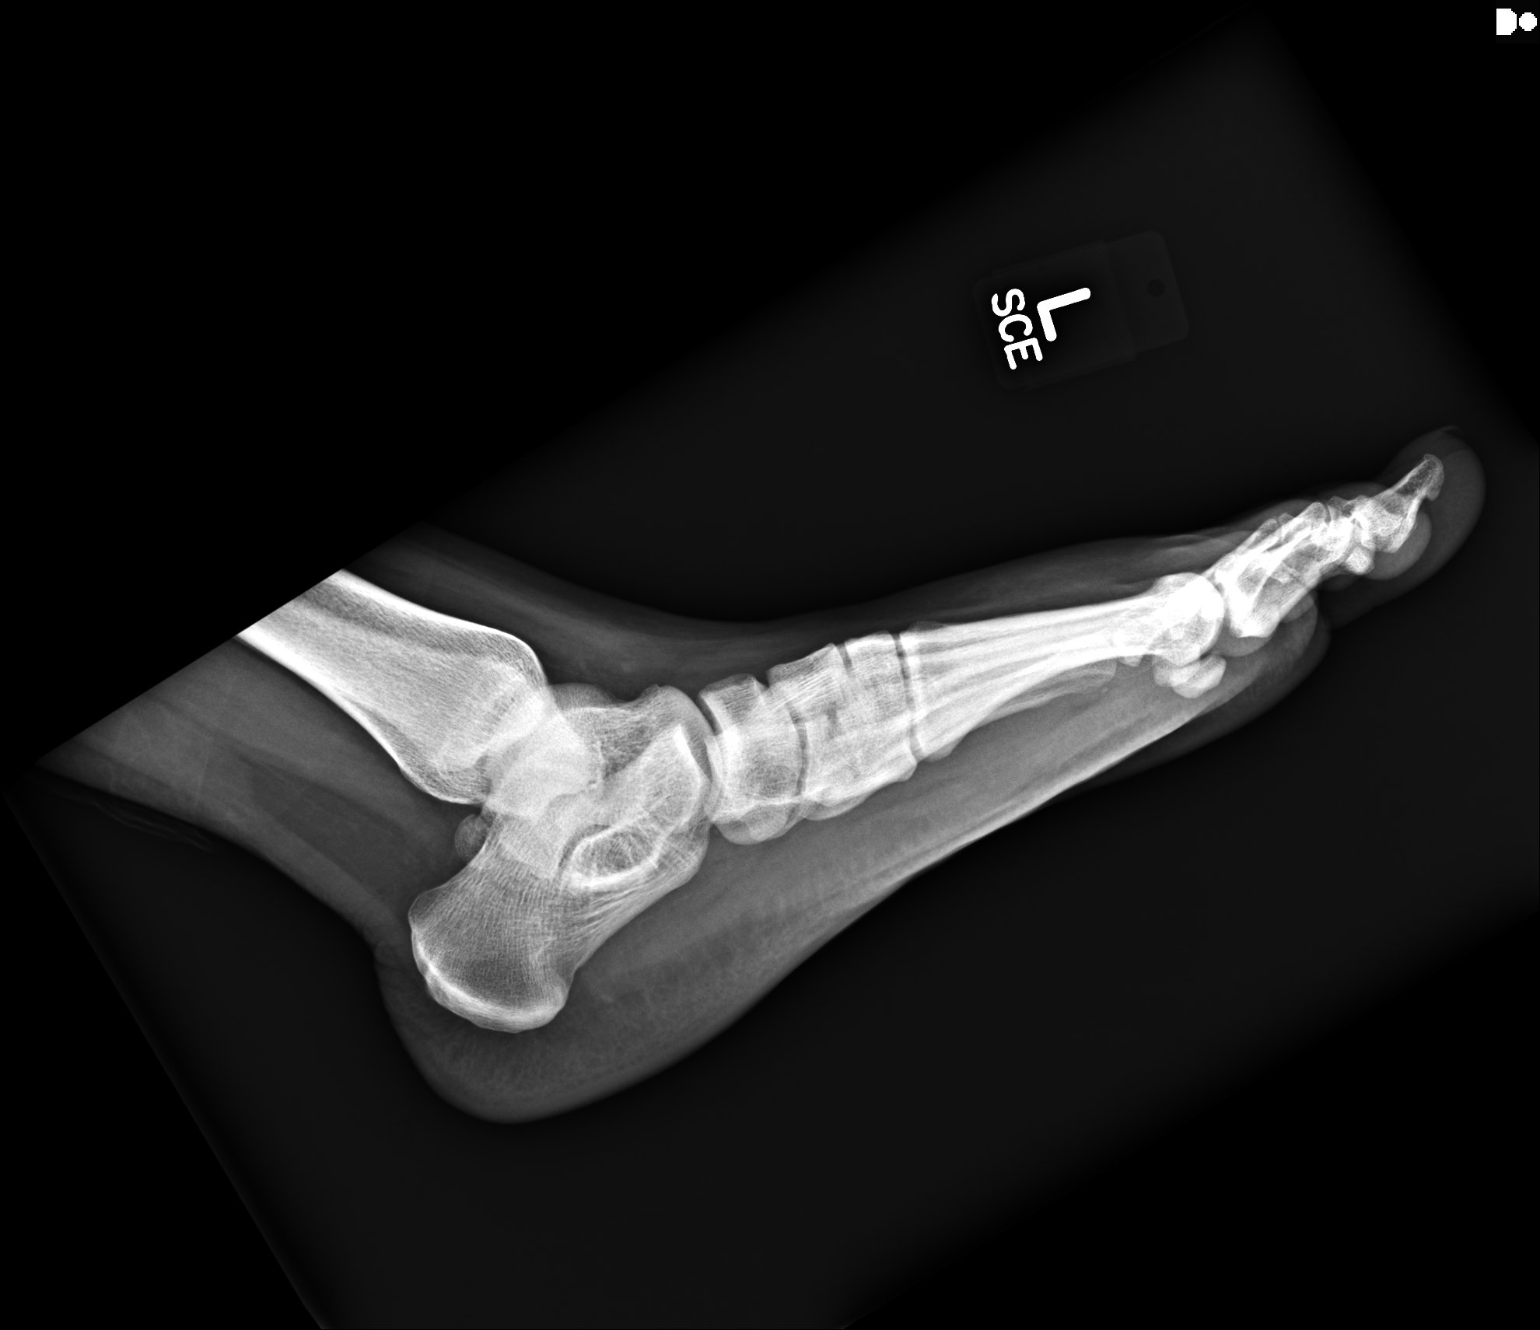

[3 of 3 positions shown; findings below may reference images not displayed]

FINDINGS: Probable pes planus on the lateral nonweightbearing view. No acute
fracture is identified. The alignment of the bones of the foot is
anatomic.

No ankle radiograph is present in this patient with ankle pain.
IMPRESSION: No acute osseous abnormality.

## 2016-09-13 IMAGING — US US OB TRANSVAGINAL
1 series · 13 of 28 positions shown · non-contrast
Comparison: None.

CLINICAL DATA: Acute onset of generalized abdominal pain and
vaginal bleeding. Initial encounter.

EXAM:
OBSTETRIC <14 WK US AND TRANSVAGINAL OB US
TECHNIQUE: Both transabdominal and transvaginal ultrasound examinations were
performed for complete evaluation of the gestation as well as the
maternal uterus, adnexal regions, and pelvic cul-de-sac.
Transvaginal technique was performed to assess early pregnancy.

[Series 1: us ob transvaginal · 0.21mm/px · 13 of 52 slices shown]
[im 2/52]
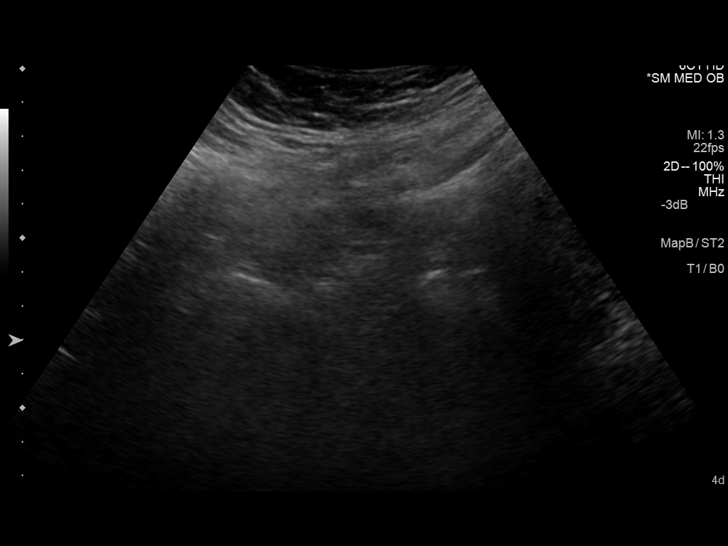
[im 6/52]
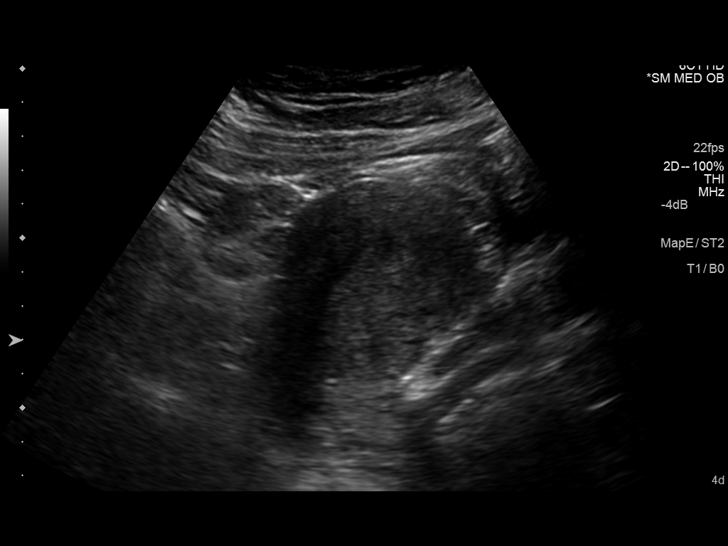
[im 10/52]
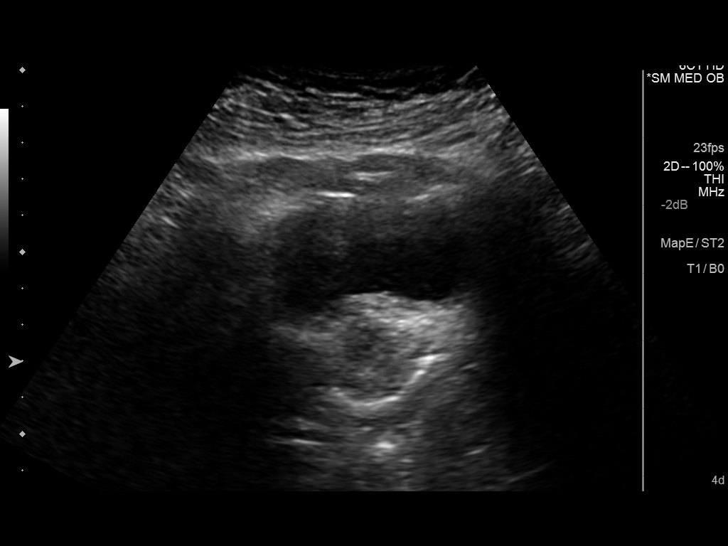
[im 14/52]
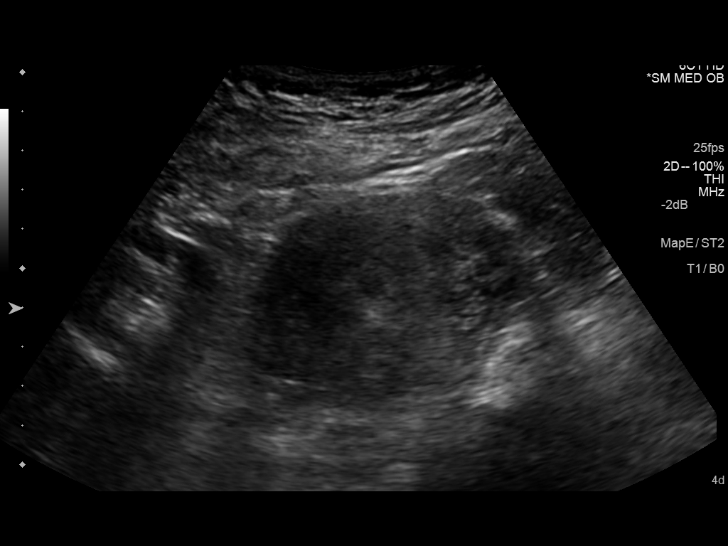
[im 18/52]
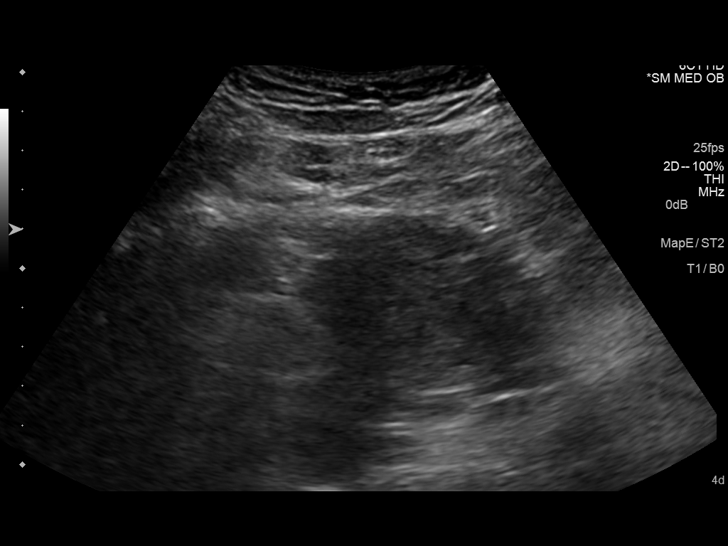
[im 21/52]
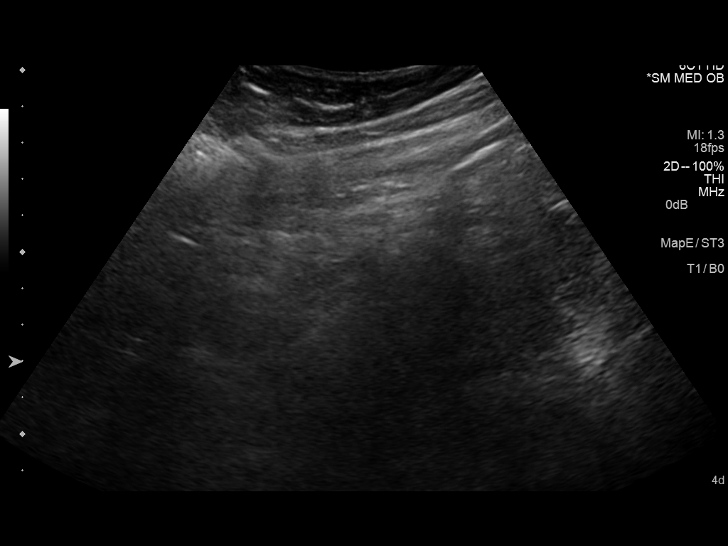
[im 27/52]
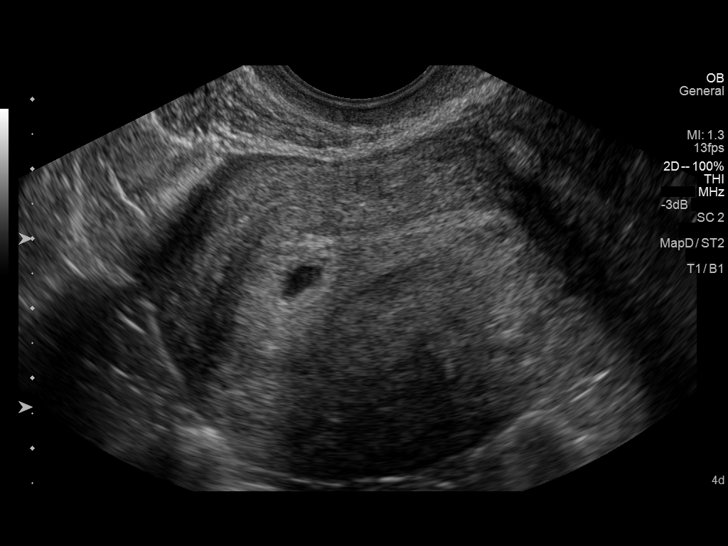
[im 31/52]
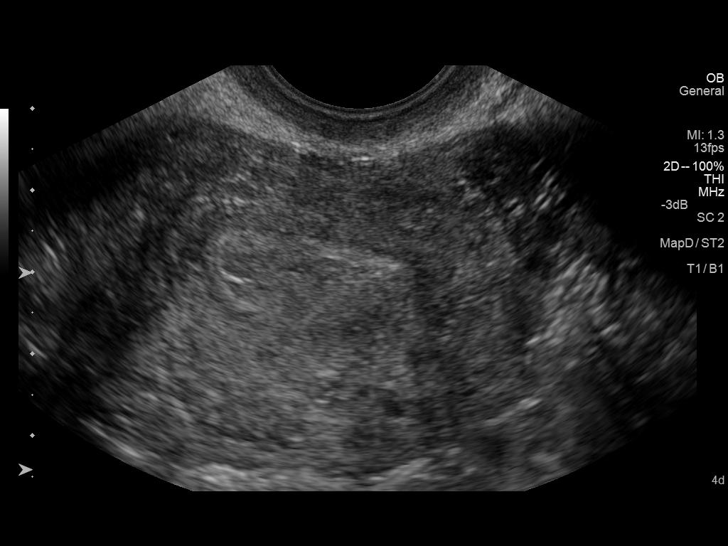
[im 35/52]
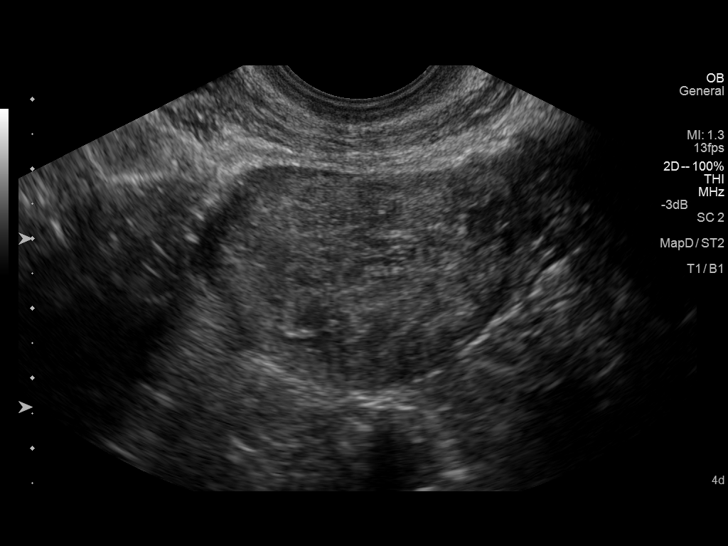
[im 38/52]
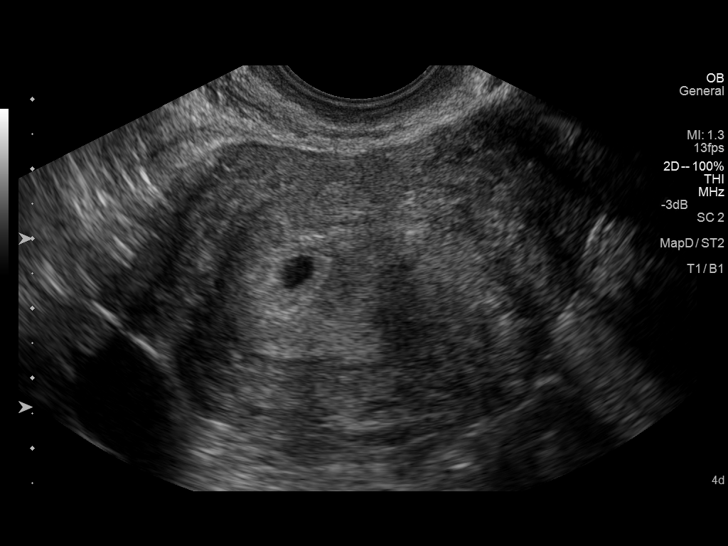
[im 42/52]
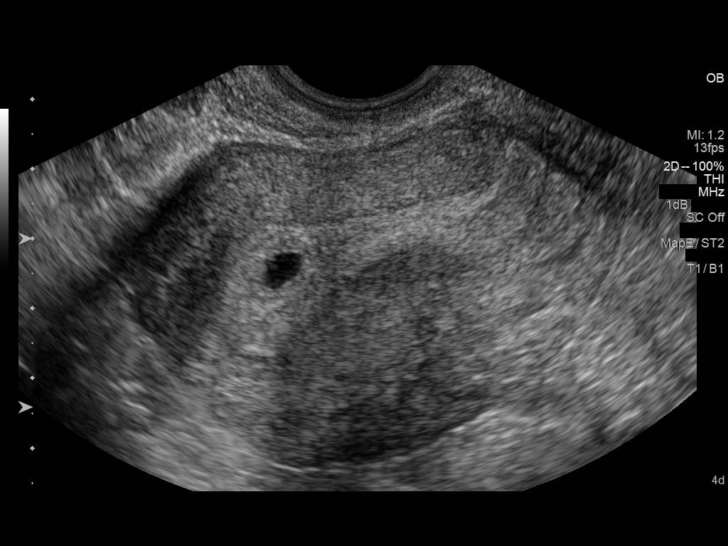
[im 46/52]
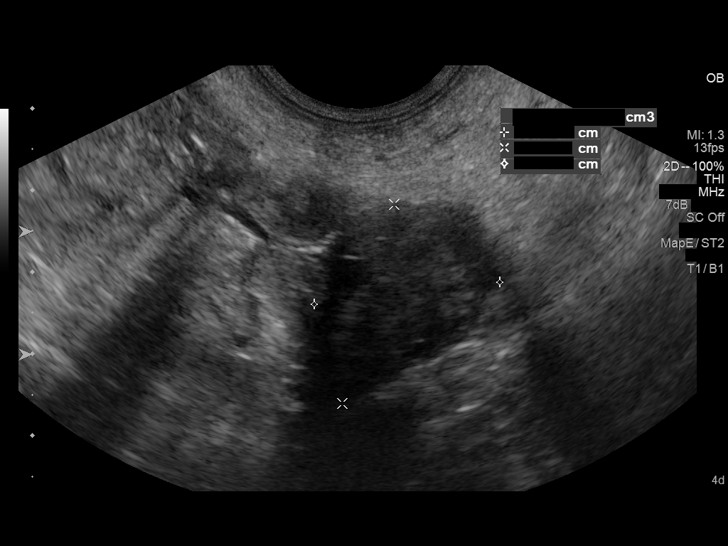
[im 50/52]
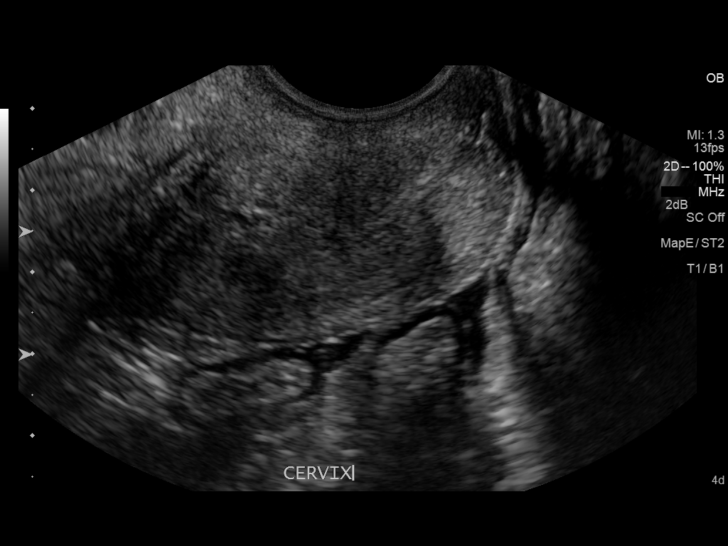

[13 of 28 positions shown; findings below may reference images not displayed]

FINDINGS: Intrauterine gestational sac: Visualized/normal in shape.

Yolk sac:  No

Embryo:  No

Cardiac Activity: N/A

MSD: 5.9  mm   5 w   2  d

Maternal uterus/adnexae: No subchorionic hemorrhage is noted. The
uterus is otherwise unremarkable.

The ovaries are within normal limits. The right ovary measures 3.8 x
2.9 x 2.5 cm, while the left ovary measures 2.7 x 2.5 x 2.3 cm. No
suspicious adnexal masses are seen; there is no evidence for ovarian
torsion.

No free fluid is seen within the pelvic cul-de-sac.
IMPRESSION: Single intrauterine gestational sac noted, with a mean sac diameter
of 6 mm, corresponding to a gestational age of 5 weeks 2 days. This
does not match the gestational age by LMP, though it remains too
early to determine a new estimated date of delivery. No yolk sac or
embryo is yet seen, within normal limits.

## 2016-10-12 ENCOUNTER — Ambulatory Visit: Payer: Medicaid Other | Attending: Neurology | Admitting: Physical Therapy

## 2016-10-12 DIAGNOSIS — R262 Difficulty in walking, not elsewhere classified: Secondary | ICD-10-CM | POA: Insufficient documentation

## 2016-10-12 DIAGNOSIS — M5442 Lumbago with sciatica, left side: Secondary | ICD-10-CM | POA: Insufficient documentation

## 2016-10-12 DIAGNOSIS — G8929 Other chronic pain: Secondary | ICD-10-CM | POA: Insufficient documentation
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
## 2016-10-17 ENCOUNTER — Other Ambulatory Visit: Payer: Self-pay | Admitting: Neurology

## 2016-10-17 DIAGNOSIS — R202 Paresthesia of skin: Principal | ICD-10-CM

## 2016-10-17 DIAGNOSIS — R200 Anesthesia of skin: Secondary | ICD-10-CM

## 2016-10-24 ENCOUNTER — Other Ambulatory Visit: Payer: Self-pay | Admitting: Neurology

## 2016-10-24 DIAGNOSIS — M542 Cervicalgia: Secondary | ICD-10-CM
